# Patient Record
Sex: Female | Born: 1960 | Race: White | Hispanic: No | Marital: Married | Smoking: Never smoker
Health system: Southern US, Community
[De-identification: ages and names within clinical notes are randomized; demographics above are authoritative.]

## PROBLEM LIST (undated history)

## (undated) DIAGNOSIS — I10 Essential (primary) hypertension: Secondary | ICD-10-CM

## (undated) DIAGNOSIS — G5600 Carpal tunnel syndrome, unspecified upper limb: Secondary | ICD-10-CM

## (undated) DIAGNOSIS — K219 Gastro-esophageal reflux disease without esophagitis: Secondary | ICD-10-CM

## (undated) DIAGNOSIS — F32A Depression, unspecified: Secondary | ICD-10-CM

## (undated) DIAGNOSIS — J309 Allergic rhinitis, unspecified: Secondary | ICD-10-CM

## (undated) DIAGNOSIS — J189 Pneumonia, unspecified organism: Secondary | ICD-10-CM

## (undated) DIAGNOSIS — K589 Irritable bowel syndrome without diarrhea: Secondary | ICD-10-CM

## (undated) DIAGNOSIS — H269 Unspecified cataract: Secondary | ICD-10-CM

## (undated) DIAGNOSIS — F419 Anxiety disorder, unspecified: Secondary | ICD-10-CM

## (undated) DIAGNOSIS — F329 Major depressive disorder, single episode, unspecified: Secondary | ICD-10-CM

## (undated) DIAGNOSIS — Z9289 Personal history of other medical treatment: Secondary | ICD-10-CM

## (undated) HISTORY — DX: Anxiety disorder, unspecified: F41.9

## (undated) HISTORY — DX: Gastro-esophageal reflux disease without esophagitis: K21.9

## (undated) HISTORY — PX: BILATERAL CARPAL TUNNEL RELEASE: SHX6508

## (undated) HISTORY — DX: Irritable bowel syndrome, unspecified: K58.9

## (undated) HISTORY — DX: Depression, unspecified: F32.A

## (undated) HISTORY — DX: Unspecified cataract: H26.9

## (undated) HISTORY — DX: Personal history of other medical treatment: Z92.89

## (undated) HISTORY — DX: Allergic rhinitis, unspecified: J30.9

## (undated) HISTORY — DX: Major depressive disorder, single episode, unspecified: F32.9

## (undated) HISTORY — DX: Carpal tunnel syndrome, unspecified upper limb: G56.00

## (undated) HISTORY — DX: Essential (primary) hypertension: I10

## (undated) HISTORY — DX: Pneumonia, unspecified organism: J18.9

## (undated) HISTORY — PX: TUBAL LIGATION: SHX77

---

## 2005-04-11 ENCOUNTER — Ambulatory Visit: Payer: Self-pay | Admitting: Internal Medicine

## 2005-07-31 ENCOUNTER — Ambulatory Visit: Payer: Self-pay | Admitting: Unknown Physician Specialty

## 2006-10-10 ENCOUNTER — Ambulatory Visit: Payer: Self-pay | Admitting: Unknown Physician Specialty

## 2006-11-26 ENCOUNTER — Ambulatory Visit: Payer: Self-pay | Admitting: Unknown Physician Specialty

## 2007-03-25 IMAGING — US ABDOMEN ULTRASOUND
1 series · 17 of 25 positions shown · non-contrast
Comparison: none

REASON FOR EXAM: Abdominal Pain Epigastric
COMMENTS:

[Series 1: abdomen ultrasound · 17 of 49 slices shown]
[im 1/49]
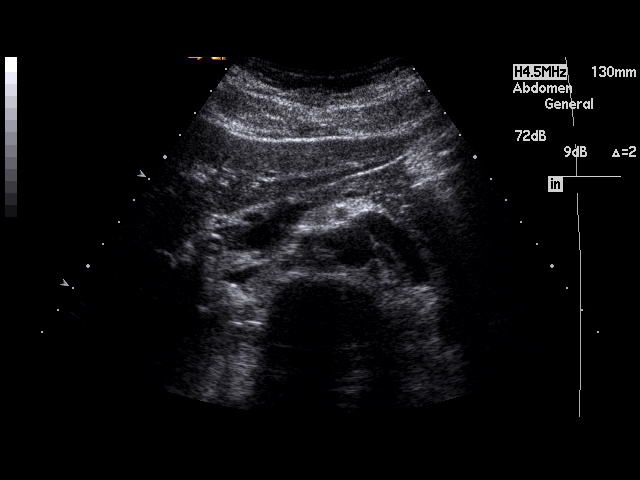
[im 5/49]
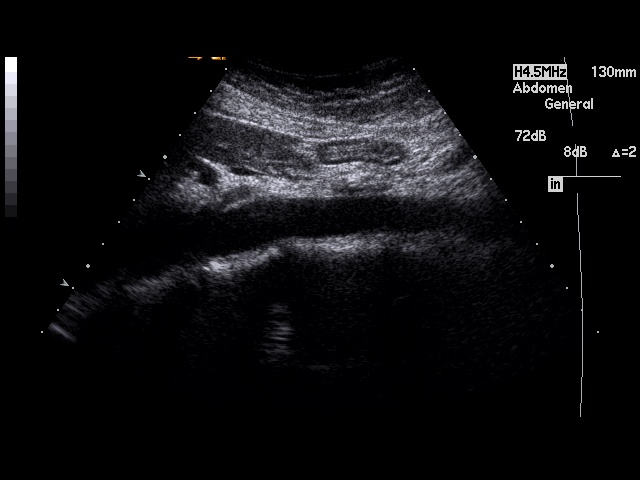
[im 7/49]
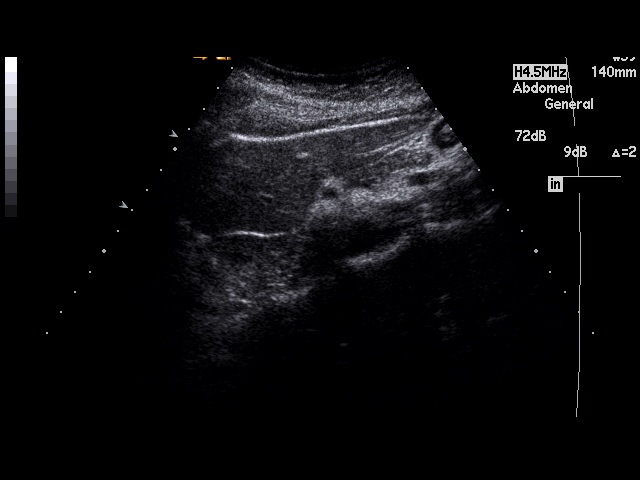
[im 11/49]
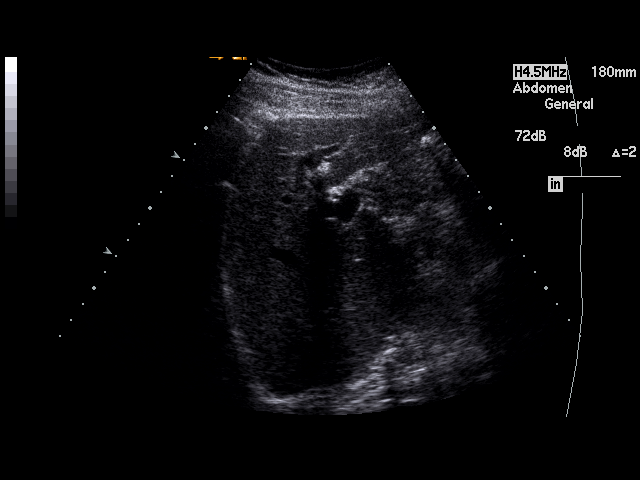
[im 13/49]
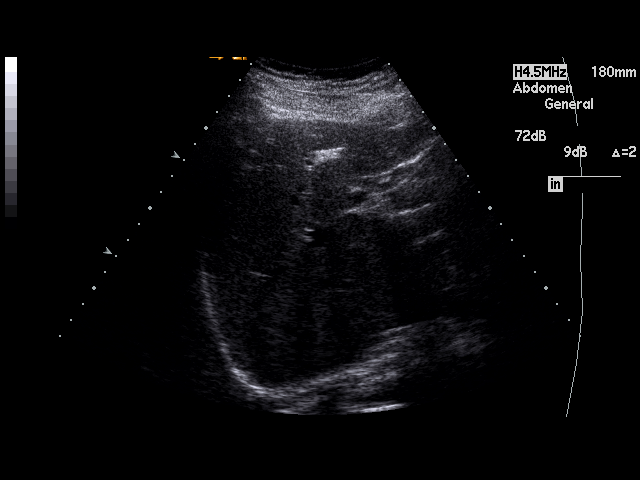
[im 17/49]
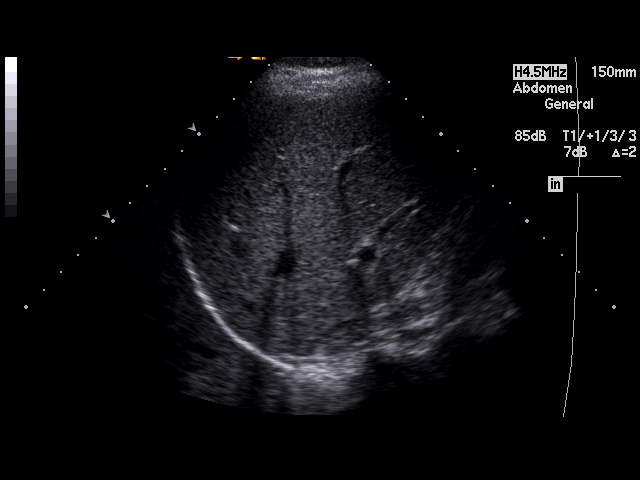
[im 19/49]
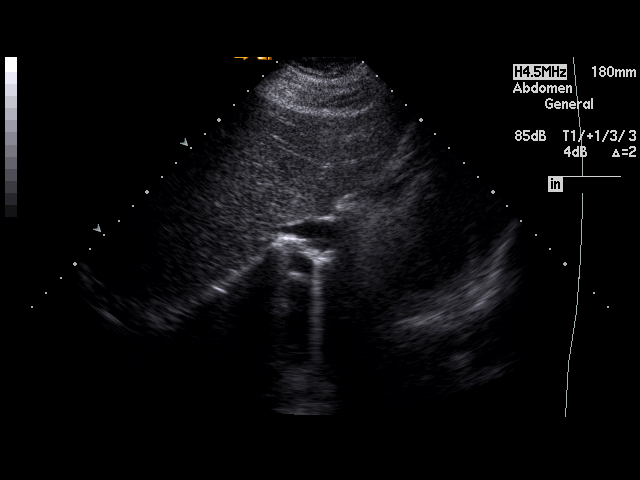
[im 23/49]
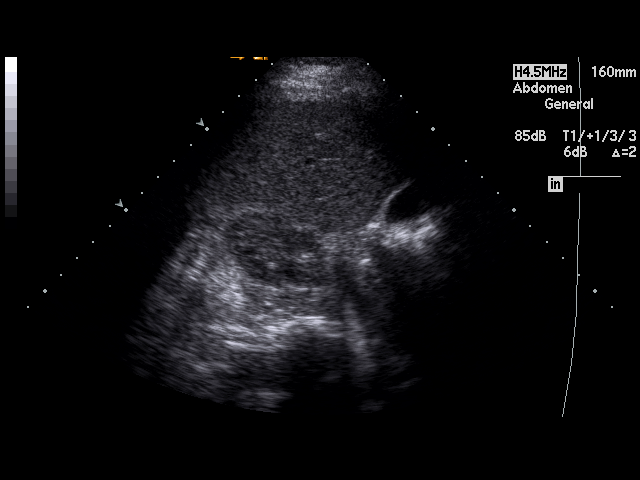
[im 25/49]
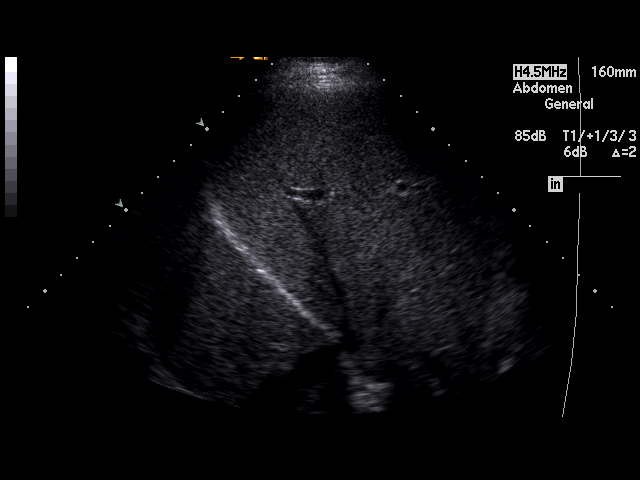
[im 27/49]
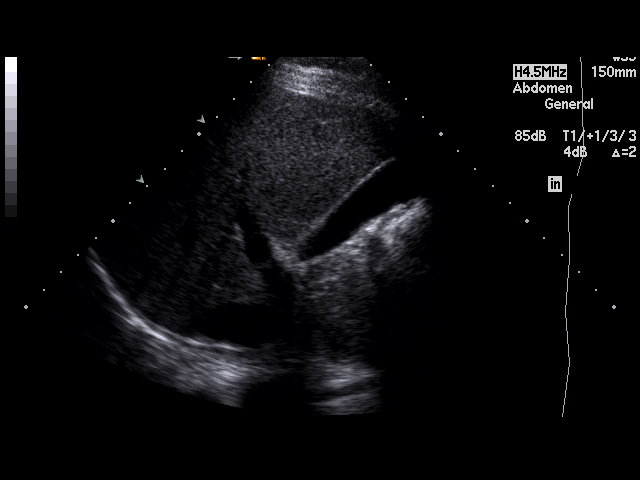
[im 31/49]
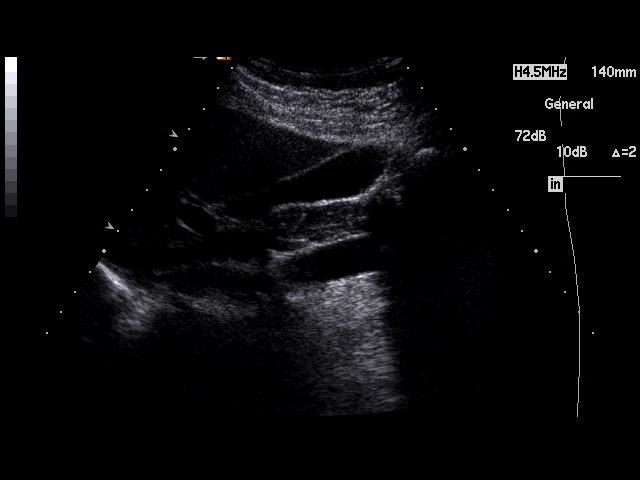
[im 33/49]
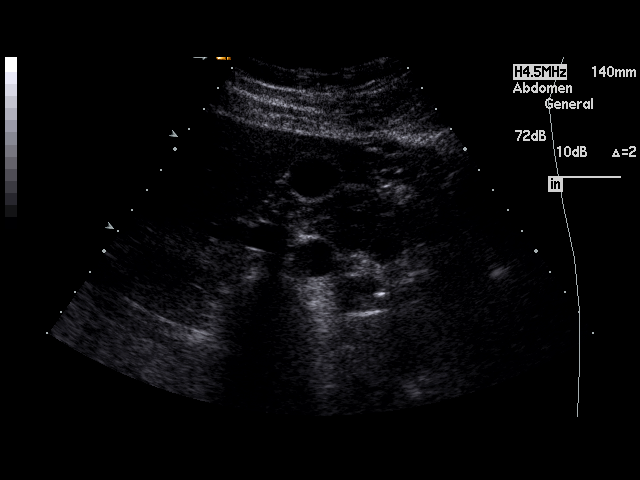
[im 37/49]
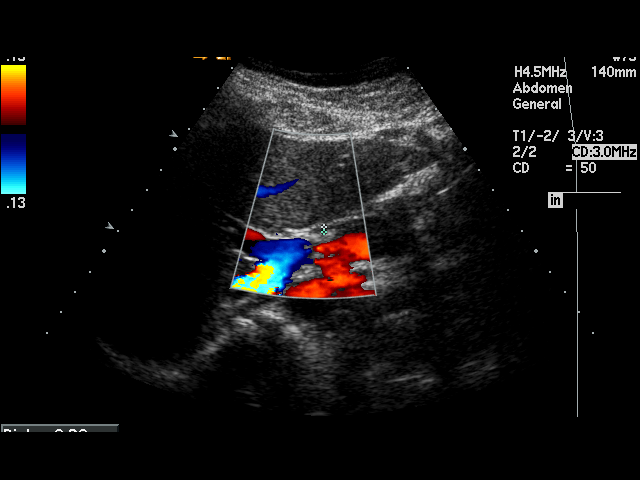
[im 39/49]
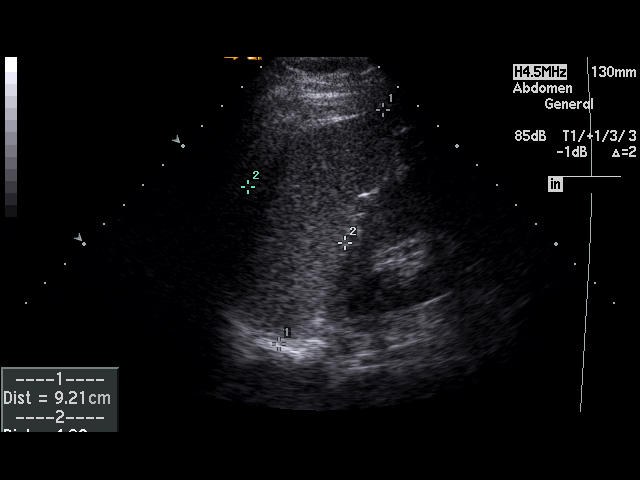
[im 43/49]
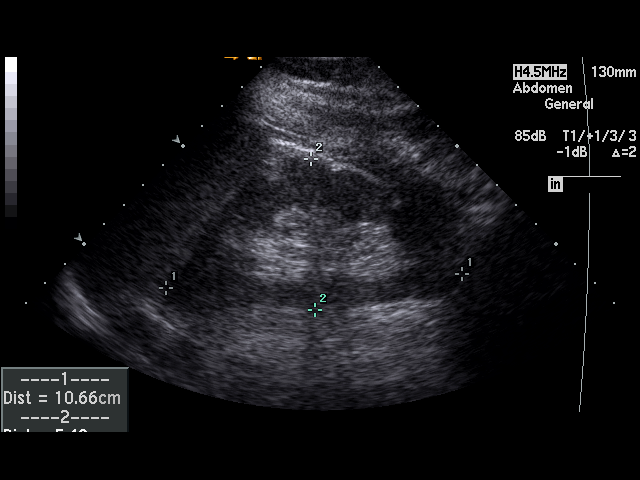
[im 45/49]
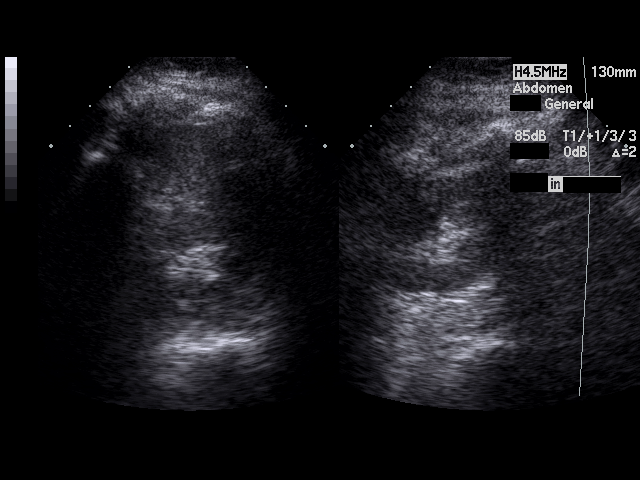
[im 49/49]
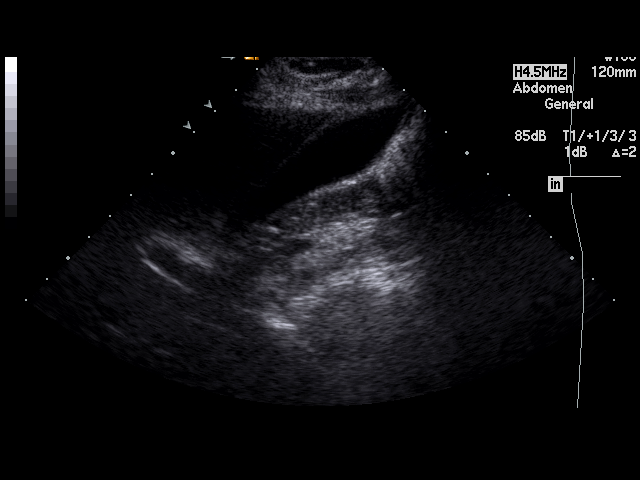

[17 of 25 positions shown; findings below may reference images not displayed]

PROCEDURE:     US  - US ABDOMEN GENERAL SURVEY  - November 26, 2006  [DATE]

RESULT:     The liver, spleen, pancreas and abdominal aorta are normal in
appearance. No gallstones are seen. There is no thickening of the
gallbladder wall. The common bile duct measures 2.4 mm in diameter, which is
within normal limits. The kidneys show no hydronephrosis. There is no
ascites.
IMPRESSION: 1.     No significant abnormalities are noted.

## 2007-07-24 ENCOUNTER — Ambulatory Visit: Payer: Self-pay | Admitting: Unknown Physician Specialty

## 2007-11-20 ENCOUNTER — Ambulatory Visit: Payer: Self-pay | Admitting: Unknown Physician Specialty

## 2008-12-07 ENCOUNTER — Ambulatory Visit: Payer: Self-pay | Admitting: Unknown Physician Specialty

## 2009-06-15 ENCOUNTER — Ambulatory Visit: Payer: Self-pay | Admitting: Urology

## 2009-08-08 ENCOUNTER — Emergency Department: Payer: Self-pay | Admitting: Emergency Medicine

## 2009-09-17 HISTORY — PX: COLONOSCOPY: SHX174

## 2009-12-20 ENCOUNTER — Ambulatory Visit: Payer: Self-pay | Admitting: Unknown Physician Specialty

## 2010-05-01 ENCOUNTER — Emergency Department: Payer: Self-pay | Admitting: Emergency Medicine

## 2011-01-01 ENCOUNTER — Ambulatory Visit: Payer: Self-pay | Admitting: Unknown Physician Specialty

## 2011-09-18 HISTORY — PX: BREAST BIOPSY: SHX20

## 2011-09-18 HISTORY — PX: BREAST SURGERY: SHX581

## 2012-01-23 ENCOUNTER — Ambulatory Visit: Payer: Self-pay | Admitting: Family Medicine

## 2012-11-25 ENCOUNTER — Ambulatory Visit: Payer: Self-pay | Admitting: Family Medicine

## 2014-02-10 ENCOUNTER — Ambulatory Visit: Payer: Self-pay | Admitting: Family Medicine

## 2014-09-17 HISTORY — PX: ESOPHAGOGASTRODUODENOSCOPY ENDOSCOPY: SHX5814

## 2016-01-31 ENCOUNTER — Other Ambulatory Visit: Payer: Self-pay | Admitting: Obstetrics and Gynecology

## 2016-01-31 DIAGNOSIS — Z1231 Encounter for screening mammogram for malignant neoplasm of breast: Secondary | ICD-10-CM

## 2016-03-13 ENCOUNTER — Ambulatory Visit
Admission: RE | Admit: 2016-03-13 | Discharge: 2016-03-13 | Disposition: A | Discharge status: Home / Self Care | Payer: Managed Care, Other (non HMO) | Source: Ambulatory Visit | Attending: Obstetrics and Gynecology | Admitting: Obstetrics and Gynecology

## 2016-03-13 DIAGNOSIS — Z1231 Encounter for screening mammogram for malignant neoplasm of breast: Secondary | ICD-10-CM | POA: Diagnosis not present

## 2016-03-13 DIAGNOSIS — Z9289 Personal history of other medical treatment: Secondary | ICD-10-CM

## 2016-03-13 HISTORY — DX: Personal history of other medical treatment: Z92.89

## 2016-08-17 DIAGNOSIS — J189 Pneumonia, unspecified organism: Secondary | ICD-10-CM

## 2016-08-17 HISTORY — DX: Pneumonia, unspecified organism: J18.9

## 2017-01-18 ENCOUNTER — Other Ambulatory Visit: Payer: Self-pay | Admitting: Certified Nurse Midwife

## 2017-01-18 NOTE — Telephone Encounter (Signed)
Please advise for refill. Thank you.  

## 2017-02-01 ENCOUNTER — Other Ambulatory Visit: Payer: Self-pay | Admitting: Obstetrics and Gynecology

## 2017-03-27 ENCOUNTER — Ambulatory Visit: Payer: Self-pay | Admitting: Certified Nurse Midwife

## 2017-04-11 ENCOUNTER — Other Ambulatory Visit: Payer: Self-pay | Admitting: Certified Nurse Midwife

## 2017-04-11 ENCOUNTER — Ambulatory Visit: Payer: Self-pay | Admitting: Certified Nurse Midwife

## 2017-04-11 NOTE — Telephone Encounter (Signed)
Please advise for refill. Thank you.  

## 2017-04-11 NOTE — Telephone Encounter (Signed)
I think this is usually prescribed by her PCP

## 2017-06-03 ENCOUNTER — Encounter: Payer: Self-pay | Admitting: Certified Nurse Midwife

## 2017-06-03 ENCOUNTER — Ambulatory Visit (INDEPENDENT_AMBULATORY_CARE_PROVIDER_SITE_OTHER): Payer: Commercial Managed Care - PPO | Admitting: Certified Nurse Midwife

## 2017-06-03 VITALS — BP 132/72 | HR 68 | Ht 63.0 in | Wt 119.0 lb

## 2017-06-03 DIAGNOSIS — F329 Major depressive disorder, single episode, unspecified: Secondary | ICD-10-CM | POA: Diagnosis not present

## 2017-06-03 DIAGNOSIS — Z1231 Encounter for screening mammogram for malignant neoplasm of breast: Secondary | ICD-10-CM | POA: Diagnosis not present

## 2017-06-03 DIAGNOSIS — F32A Depression, unspecified: Secondary | ICD-10-CM

## 2017-06-03 DIAGNOSIS — Z01419 Encounter for gynecological examination (general) (routine) without abnormal findings: Secondary | ICD-10-CM

## 2017-06-03 DIAGNOSIS — F419 Anxiety disorder, unspecified: Secondary | ICD-10-CM

## 2017-06-03 DIAGNOSIS — Z1239 Encounter for other screening for malignant neoplasm of breast: Secondary | ICD-10-CM

## 2017-06-03 DIAGNOSIS — Z124 Encounter for screening for malignant neoplasm of cervix: Secondary | ICD-10-CM

## 2017-06-03 MED ORDER — ALPRAZOLAM 0.5 MG PO TABS: ORAL_TABLET | ORAL | 0 refills | Status: DC

## 2017-06-03 MED ORDER — BUPROPION HCL ER (XL) 300 MG PO TB24: 300.0000 mg | ORAL_TABLET | Freq: Every day | ORAL | 0 refills | Status: DC

## 2017-06-03 NOTE — Progress Notes (Signed)
Gynecology Annual Exam  PCP: Patient, No Pcp Per  Chief Complaint:  Chief Complaint  Patient presents with  . Gynecologic Exam    History of Present Illness:Katie Gallegos presents today for her annual exam . She is a 56 year old Caucasian/White female , G 2 P 2 0 0 2 , whose LMP was 03/02/2016.  She is having more hot flashes and night sweats since her LMP and has started using an OTC product called Amberen about 2 months ago to help with the vasomotor symptoms, but hasn't seen any decrease in hot flashes or night sweats yet. Her anxiety has also increased with menopause.   The patient's past medical history is notable for a history of anxiety, depression, and hypertension. She is taking Wellbutrin 300 mgm XL daily and takes Xanax 0.5 mgm about 3x/week. Since her last annual GYN exam dated 03/22/2016, she is also under a little more stress at work. More recently she has been diagnosed with arthritis in her knees and she is currently using Lotrisone for a fungal rash under her right arm. She was hospitalized in Dec 2017 for pneumonia and pleurisy. She also reports that she has related the body rashes she started getting in 2016 to long exposure to the sun.  She had been referred for a screening colonoscopy in 2016 but it had not been 10 years since her last colonoscopy, so it was not needed. She is unsure when her next colonoscopy is due. She did have an EGD by Dr Markham Jordan.for GERD in 2016. Her PCP is at [facility] and they manage her HTN and labs.  She is sexually active. She does have vaginal dryness. She is using lubricants with symptomatic relief.  Her most recent pap smear was obtained 01/15/2014 and was with negative cells and negative HPV DNA. She has her Pap smears every 3 years.  Her most recent mammogram obtained on 03/13/2016 was normal. There is no family history of breast cancer. There is no family history of ovarian cancer. The patient does do monthly self breast exams.     A DEXA scan is not applicable for this patient.  The patient does not smoke.  The patient does drink 2 beers/week  The patient does not use illegal drugs.  The patient exercises regularly. Her BMI is 21.08 kg/m2 The patient does get adequate calcium in her diet.  She has her labs managed by her PCP. Her recent cholesterol screen in 2018 was normal.  Review of Systems: Review of Systems  Constitutional: Negative for chills, fever and weight loss.       6# weight gain  HENT: Negative for congestion, sinus pain and sore throat.   Eyes: Negative for blurred vision and pain.  Respiratory: Negative for hemoptysis, shortness of breath and wheezing.   Cardiovascular: Negative for chest pain, palpitations and leg swelling.  Gastrointestinal: Positive for constipation and heartburn. Negative for abdominal pain, blood in stool, diarrhea, nausea and vomiting.  Genitourinary: Negative for dysuria, frequency, hematuria and urgency.  Musculoskeletal: Positive for joint pain (bilateral knees). Negative for back pain and myalgias.  Skin: Positive for rash (under right arm). Negative for itching.  Neurological: Negative for dizziness, tingling and headaches.  Endo/Heme/Allergies: Negative for environmental allergies and polydipsia. Does not bruise/bleed easily.       Negative for hirsutism. Positive for hot flashes and night sweats   Psychiatric/Behavioral: Positive for depression. The patient is nervous/anxious. The patient does not have insomnia.  Past Medical History:  Past Medical History:  Diagnosis Date  . Allergic rhinitis   . Anxiety   . Carpal tunnel syndrome   . Depression   . GERD (gastroesophageal reflux disease)   . History of mammogram 03/13/2016   negative  . Hypertension   . IBS (irritable bowel syndrome)   . Pneumonia 08/2016    Past Surgical History:  Past Surgical History:  Procedure Laterality Date  . BILATERAL CARPAL TUNNEL RELEASE  March and April 2017  .  BREAST SURGERY Left 2013   breast biopsy  . COLONOSCOPY  2011  . ESOPHAGOGASTRODUODENOSCOPY ENDOSCOPY  2016   GERD  . TUBAL LIGATION      Family History:  Family History  Problem Relation Age of Onset  . Hypertension Mother   . Stroke Mother   . Hypertension Father   . Kidney Stones Father   . Uterine cancer Paternal Aunt 40  . Diabetes Maternal Grandmother   . Heart disease Maternal Grandmother   . Bladder Cancer Paternal Grandfather   . Colon cancer Paternal Grandfather 21    Social History:  Social History   Social History  . Marital status: Married    Spouse name: Sam  . Number of children: 2  . Years of education: N/A   Occupational History  . Not on file.   Social History Main Topics  . Smoking status: Never Smoker  . Smokeless tobacco: Never Used  . Alcohol use Yes     Comment: 2 beers per week  . Drug use: No  . Sexual activity: Yes    Birth control/ protection: Post-menopausal   Other Topics Concern  . Not on file   Social History Narrative  . No narrative on file    Allergies:  Allergies  Allergen Reactions  . Penicillin G Other (See Comments)    Taken as child ? reaction    Medications: Prior to Admission medications   Medication Sig Start Date End Date Taking? Authorizing Provider  ALPRAZolam (XANAX) 0.5 MG tablet alprazolam 0.5 mg tablet  TAKE 1 TABLET TWICE A DAY AS NEEDED   Yes [provider]  buPROPion (WELLBUTRIN XL) 300 MG 24 hr tablet  05/13/17  Yes [provider]  clotrimazole-betamethasone (LOTRISONE) cream  04/30/17  Yes [provider]  fluticasone (FLONASE) 50 MCG/ACT nasal spray  04/14/17  Yes [provider]  meloxicam (MOBIC) 15 MG tablet meloxicam 15 mg tablet   Yes [provider]  Multiple Vitamin (MULTIVITAMIN) capsule Take by mouth.   Yes [provider]  valsartan-hydrochlorothiazide (DIOVAN-HCT) 160-25 MG tablet valsartan 160 mg-hydrochlorothiazide 25 mg tablet   TAKE 1 TABLET EVERY DAY   Yes [provider]    Physical Exam Vitals: BP 132/72   Pulse 68   Ht  (1.6 m)   Wt 119 lb (54 kg)   LMP  (Exact Date)   BMI 21.08 kg/m   General: petite WF in NAD, appears anxious HEENT: normocephalic, anicteric Neck: no thyroid enlargement, no palpable nodules, no cervical lymphadenopathy  Pulmonary: No increased work of breathing, CTAB Cardiovascular: RRR, without murmur  Breast: Breast symmetrical, no tenderness, no palpable nodules or masses, no skin or nipple retraction present, no nipple discharge.  No axillary, infraclavicular or supraclavicular lymphadenopathy. Abdomen: Soft, non-tender, non-distended.  Umbilicus without lesions.  No hepatomegaly or masses palpable. No evidence of hernia. Genitourinary:  External: Normal external female genitalia.  Normal urethral meatus, normal  Bartholin's and Skene's glands.    Vagina:  Normal vaginal mucosa, no evidence of prolapse.    Cervix: Grossly normal in appearance, no bleeding, non-tender  Uterus: Anteverted, normal size, shape, and consistency, mobile, and non-tender  Adnexa: No adnexal masses, non-tender  Rectal: deferred  Lymphatic: no evidence of inguinal lymphadenopathy Extremities: no edema, erythema, or tenderness Neurologic: Grossly intact Psychiatric: mood appropriate, affect full     Assessment: 56 y.o. annual gynecological exam Vasomotor symptoms due to menopause Increased anxiety and depression  Plan:  Refilled Wellbutrin 300 mgm XL and Xanax, but recommend patient make appointment with PCP to discuss changing or adding to medication for depression and anxiety. Has been on current medication for >10 years. Was tried on other medications and did not do well per patient report.  1) Breast cancer screening - recommend monthly self breast exam. Mammogram was ordered today.  2) Colon cancer screen: review of [facility] records done and last colonoscopy was in November  2008  3) Cervical cancer screening - Pap was done. ASCCP guidelines and rational discussed.  Patient opts for every 3 years screening interval  4) Contraception - HAs a BTL, but contraception no longer needed now that she is postmenopausal.  5) Routine healthcare maintenance including cholesterol and diabetes screening managed by PCP   Farrel Conners, CNM

## 2017-06-04 ENCOUNTER — Encounter: Payer: Self-pay | Admitting: Certified Nurse Midwife

## 2017-06-04 DIAGNOSIS — K589 Irritable bowel syndrome without diarrhea: Secondary | ICD-10-CM | POA: Insufficient documentation

## 2017-06-04 DIAGNOSIS — J309 Allergic rhinitis, unspecified: Secondary | ICD-10-CM | POA: Insufficient documentation

## 2017-06-04 DIAGNOSIS — F32A Depression, unspecified: Secondary | ICD-10-CM | POA: Insufficient documentation

## 2017-06-04 DIAGNOSIS — I10 Essential (primary) hypertension: Secondary | ICD-10-CM | POA: Insufficient documentation

## 2017-06-04 DIAGNOSIS — F329 Major depressive disorder, single episode, unspecified: Secondary | ICD-10-CM | POA: Insufficient documentation

## 2017-06-04 DIAGNOSIS — K219 Gastro-esophageal reflux disease without esophagitis: Secondary | ICD-10-CM | POA: Insufficient documentation

## 2017-06-04 DIAGNOSIS — F419 Anxiety disorder, unspecified: Secondary | ICD-10-CM | POA: Insufficient documentation

## 2017-06-05 LAB — IGP, APTIMA HPV
HPV Aptima: POSITIVE — AB
PAP SMEAR COMMENT: 0

## 2017-06-12 ENCOUNTER — Encounter: Payer: Self-pay | Admitting: Certified Nurse Midwife

## 2017-06-12 DIAGNOSIS — R87618 Other abnormal cytological findings on specimens from cervix uteri: Secondary | ICD-10-CM | POA: Insufficient documentation

## 2017-06-12 DIAGNOSIS — R8789 Other abnormal findings in specimens from female genital organs: Secondary | ICD-10-CM | POA: Insufficient documentation

## 2017-09-27 ENCOUNTER — Ambulatory Visit
Admission: RE | Admit: 2017-09-27 | Discharge: 2017-09-27 | Disposition: A | Discharge status: Home / Self Care | Payer: Commercial Managed Care - PPO | Source: Ambulatory Visit | Attending: Certified Nurse Midwife | Admitting: Certified Nurse Midwife

## 2017-09-27 DIAGNOSIS — Z1231 Encounter for screening mammogram for malignant neoplasm of breast: Secondary | ICD-10-CM | POA: Insufficient documentation

## 2017-09-27 DIAGNOSIS — Z1239 Encounter for other screening for malignant neoplasm of breast: Secondary | ICD-10-CM

## 2017-10-02 ENCOUNTER — Encounter: Payer: Self-pay | Admitting: Certified Nurse Midwife

## 2018-10-30 NOTE — Progress Notes (Signed)
Gynecology Annual Exam  PCP: Patient, No Pcp Per  Chief Complaint:  Chief Complaint  Patient presents with  . Gynecologic Exam    pain and bleeding during intercourse x last 6-9 months    History of Present Illness:Katie Gallegos presents today for her annual exam . She is a 58 year old postmenopausal Caucasian/White female , G 2 P 2 0 0 2 , whose LMP was 03/02/2016.  She reports having postcoital bleeding and dyspareunia x 6 mos. Has had vaginal dryness and was using lubricants at one point, but has stopped using. She denies any vulvar itching or irritation. She notes bleeding when wiping after intercourse. She continues to have hot flashes. Is taking an OTC supplement to combat hot flashes.   The patient's past medical history is notable for a history of anxiety, depression, environmental allergies, and hypertension. She is taking Wellbutrin 300 mgm XL daily and takes Xanax 0.5 mgm about 2x/month. She requests a refill on her Xanax.   Since her last annual GYN exam dated 06/03/2017, she is has seen a dermatologist for rashes under her arms and inner thighs. She was also begun on Lipitor for hypercholesterolemia and her cholesterol level hs decreased on the medication. Her last colonoscopy was 2008 . Her next is due and she needs to return the [facility]. She did have an EGD by Dr Markham JordanElliot.for GERD in 2016. [facility] and they manage her HTN and labs.   Her most recent pap smear was obtained 06/03/2017 and was with negative cells and positive HPV DNA.  Her most recent mammogram obtained on 09/27/2017 was normal. There is no family history of breast cancer. There is no family history of ovarian cancer. The patient does do monthly self breast exams.   A DEXA scan is not applicable for this patient.  The patient does not smoke.  The patient does drink 1-2 beers or glasses of wine /week  The patient does not use illegal drugs.  The patient exercises regularly.  Her BMI is 20.2 kg/m2 The patient does get adequate calcium in her diet.  She has her labs managed by her PCP. Her recent cholesterol screen in 2020 was normal.  Review of Systems: Review of Systems  Constitutional: Positive for malaise/fatigue. Negative for chills, fever and weight loss.       6# weight gain  HENT: Positive for sore throat. Negative for congestion and sinus pain.   Eyes: Negative for blurred vision and pain.  Respiratory: Positive for cough (and sneezing) and shortness of breath. Negative for hemoptysis and wheezing.   Cardiovascular: Negative for chest pain, palpitations and leg swelling.  Gastrointestinal: Positive for constipation and heartburn. Negative for abdominal pain, blood in stool, diarrhea, nausea and vomiting.  Genitourinary: Negative for dysuria, frequency, hematuria and urgency.       Positive for nocturia, dyspareunia, and postcoital spotting.  Musculoskeletal: Positive for joint pain (bilateral knees). Negative for back pain and myalgias.  Skin: Positive for rash (under right arm). Negative for itching.  Neurological: Positive for dizziness. Negative for tingling and headaches.  Endo/Heme/Allergies: Positive for environmental allergies. Negative for polydipsia. Bruises/bleeds easily.       Negative for hirsutism. Positive for hot flashes   Psychiatric/Behavioral: Positive for depression. The patient is nervous/anxious. The patient does not have insomnia.     Past Medical History:  Past Medical History:  Diagnosis Date  . Allergic rhinitis   . Anxiety   .  Carpal tunnel syndrome   . Depression   . GERD (gastroesophageal reflux disease)   . History of mammogram 03/13/2016   negative  . Hypertension   . IBS (irritable bowel syndrome)   . Pneumonia 08/2016    Past Surgical History:  Past Surgical History:  Procedure Laterality Date  . BILATERAL CARPAL TUNNEL RELEASE  March and April 2017  . BREAST SURGERY Left 2013   breast biopsy  .  COLONOSCOPY  2011  . ESOPHAGOGASTRODUODENOSCOPY ENDOSCOPY  2016   GERD  . TUBAL LIGATION      Family History:  Family History  Problem Relation Age of Onset  . Hypertension Mother   . Stroke Mother   . Hypertension Father   . Kidney Stones Father   . Uterine cancer Paternal Aunt 40  . Diabetes Maternal Grandmother   . Heart disease Maternal Grandmother   . Bladder Cancer Paternal Grandfather   . Colon cancer Paternal Grandfather 50  . Breast cancer Neg Hx     Social History:  Social History   Socioeconomic History  . Marital status: Married    Spouse name: Sam  . Number of children: 2  . Years of education: Not on file  . Highest education level: Not on file  Occupational History  . Not on file  Social Needs  . Financial resource strain: Not on file  . Food insecurity:    Worry: Not on file    Inability: Not on file  . Transportation needs:    Medical: Not on file    Non-medical: Not on file  Tobacco Use  . Smoking status: Never Smoker  . Smokeless tobacco: Never Used  Substance and Sexual Activity  . Alcohol use: Yes    Comment: 2 beers per week  . Drug use: No  . Sexual activity: Yes    Partners: Male    Birth control/protection: Post-menopausal  Lifestyle  . Physical activity:    Days per week: Not on file    Minutes per session: Not on file  . Stress: Not on file  Relationships  . Social connections:    Talks on phone: Not on file    Gets together: Not on file    Attends religious service: Not on file    Active member of club or organization: Not on file    Attends meetings of clubs or organizations: Not on file    Relationship status: Not on file  . Intimate partner violence:    Fear of current or ex partner: Not on file    Emotionally abused: Not on file    Physically abused: Not on file    Forced sexual activity: Not on file  Other Topics Concern  . Not on file  Social History Narrative  . Not on file    Allergies:  Allergies    Allergen Reactions  . Penicillin G Other (See Comments)    Taken as child ? reaction    Medications:  Current Outpatient Medications on File Prior to Visit  Medication Sig Dispense Refill  . albuterol (PROAIR HFA) 108 (90 Base) MCG/ACT inhaler ProAir HFA 90 mcg/actuation aerosol inhaler  INHALE 2 PUFS TWICE A DAY    . ANTIFUNGAL 2 % cream APPLY THIN LAYER OF CREAM TO AFFECTED AREAS BILATERAL AXILLARY GROIN AND UPPER THIGHS TWICE A DAY    . atorvastatin (LIPITOR) 20 MG tablet     . buPROPion HCl ER, XL, 450 MG TB24     . clindamycin (CLEOCIN T)  1 % external solution     . fexofenadine (ALLEGRA) 180 MG tablet Take 180 mg by mouth daily.    . fluticasone (FLONASE) 50 MCG/ACT nasal spray     . meloxicam (MOBIC) 15 MG tablet meloxicam 15 mg tablet    . OXISTAT 1 % lotion APPLY TOPICALLY TO THE AFFECTED AREA EVERY DAY    . terbinafine (LAMISIL) 250 MG tablet     . valsartan-hydrochlorothiazide (DIOVAN-HCT) 160-25 MG tablet valsartan 160 mg-hydrochlorothiazide 25 mg tablet  TAKE 1 TABLET EVERY DAY     No current facility-administered medications on file prior to visit.    Physical Exam Vitals: BP 112/64   Pulse 75   Ht 5\' 3"  (1.6 m)   Wt 114 lb (51.7 kg)   BMI 20.19 kg/m   General: petite WF in NAD, appears anxious HEENT: normocephalic, anicteric Neck: no thyroid enlargement, no palpable nodules, no cervical lymphadenopathy  Pulmonary: No increased work of breathing, CTAB Cardiovascular: RRR, without murmur  Breast: Breast symmetrical, no tenderness, no palpable nodules or masses, no skin or nipple retraction present, no nipple discharge.  No axillary, infraclavicular or supraclavicular lymphadenopathy. Abdomen: Soft, non-tender, non-distended.  Umbilicus without lesions.  No hepatomegaly or masses palpable. No evidence of hernia. Genitourinary:  External: MIld atrophic changes. Normal urethral meatus, normal Bartholin's and Skene's glands.    Vagina: Flattened rugae, no evidence  of prolapse.    Cervix: Grossly normal in appearance, no bleeding, non-tender  Uterus: Anteverted, normal size, shape, and consistency, mobile, and non-tender  Adnexa: No adnexal masses, non-tender  Rectal: deferred  Lymphatic: no evidence of inguinal lymphadenopathy Extremities: no edema, erythema, or tenderness Neurologic: Grossly intact Psychiatric: mood appropriate, affect full     Assessment: 58 y.o. annual gynecological exam Dyspareunia and postcoital spotting  Long discussion reagrding options for treatment of dyspareunia and resulting postcoital spotting. Explained difference between lubricants and moisturizers as well as options for treatment with Intrarosa, various topical estrogens and Osphena. She would like to try Premarin vaginal cream. RX for 0.5 gm nightly x 2 weeks then 3x/week. Follow up in 6-8 weeks.  Anxiety and depression  Refilled Xanax 0.5 mgm BID prn anxiety #30/RF x 1 + HRHPV on last Pap smear.  Plan:   1) Breast cancer screening - recommend monthly self breast exam. Mammogram was ordered today. Patient to schedule mamm[facility] at [facility]  2) Colon cancer screen: [facility].  3) Cervical cancer screening - Pap was done. ASCCP guidelines and rational discussed.    4) Contraception - HAs a BTL, but contraception no longer needed now that she is postmenopausal.  5) Routine healthcare maintenance including cholesterol and diabetes screening managed by PCP   6) Osteoporosis prevention: Discussed requirements for calcium and vitamin D3   Farrel Connersolleen Noel Rodier, CNM

## 2018-10-31 ENCOUNTER — Encounter: Payer: Self-pay | Admitting: Certified Nurse Midwife

## 2018-10-31 ENCOUNTER — Other Ambulatory Visit (HOSPITAL_COMMUNITY)
Admission: RE | Admit: 2018-10-31 | Discharge: 2018-10-31 | Disposition: A | Discharge status: Home / Self Care | Payer: Commercial Managed Care - PPO | Source: Ambulatory Visit | Attending: Certified Nurse Midwife | Admitting: Certified Nurse Midwife

## 2018-10-31 ENCOUNTER — Ambulatory Visit (INDEPENDENT_AMBULATORY_CARE_PROVIDER_SITE_OTHER): Payer: Commercial Managed Care - PPO | Admitting: Certified Nurse Midwife

## 2018-10-31 VITALS — BP 112/64 | HR 75 | Ht 63.0 in | Wt 114.0 lb

## 2018-10-31 DIAGNOSIS — Z01419 Encounter for gynecological examination (general) (routine) without abnormal findings: Secondary | ICD-10-CM

## 2018-10-31 DIAGNOSIS — R87618 Other abnormal cytological findings on specimens from cervix uteri: Secondary | ICD-10-CM

## 2018-10-31 DIAGNOSIS — N941 Unspecified dyspareunia: Secondary | ICD-10-CM

## 2018-10-31 DIAGNOSIS — Z1239 Encounter for other screening for malignant neoplasm of breast: Secondary | ICD-10-CM

## 2018-10-31 DIAGNOSIS — F419 Anxiety disorder, unspecified: Secondary | ICD-10-CM

## 2018-10-31 DIAGNOSIS — Z124 Encounter for screening for malignant neoplasm of cervix: Secondary | ICD-10-CM

## 2018-10-31 DIAGNOSIS — R8789 Other abnormal findings in specimens from female genital organs: Secondary | ICD-10-CM

## 2018-10-31 DIAGNOSIS — N930 Postcoital and contact bleeding: Secondary | ICD-10-CM

## 2018-10-31 DIAGNOSIS — N950 Postmenopausal bleeding: Secondary | ICD-10-CM

## 2018-10-31 MED ORDER — ALPRAZOLAM 0.5 MG PO TABS: 0.5000 mg | ORAL_TABLET | Freq: Two times a day (BID) | ORAL | 1 refills | Status: DC | PRN

## 2018-10-31 MED ORDER — ESTROGENS, CONJUGATED 0.625 MG/GM VA CREA: TOPICAL_CREAM | VAGINAL | 1 refills | Status: DC

## 2018-10-31 MED ORDER — ALPRAZOLAM 0.5 MG PO TABS: ORAL_TABLET | ORAL | 1 refills | Status: DC

## 2018-11-01 ENCOUNTER — Encounter: Payer: Self-pay | Admitting: Certified Nurse Midwife

## 2018-11-01 DIAGNOSIS — E785 Hyperlipidemia, unspecified: Secondary | ICD-10-CM | POA: Insufficient documentation

## 2018-11-05 LAB — CYTOLOGY - PAP: HPV: DETECTED — AB

## 2018-11-10 ENCOUNTER — Telehealth: Payer: Self-pay | Admitting: Certified Nurse Midwife

## 2018-11-10 NOTE — Telephone Encounter (Signed)
Lorrene was called with results of Pap smear (LGSIL Pap with positive HRHPV) and need for colposcopy. DIscussed colposcopy procedure. Scheduled for colposcopy with Dr Jerene Pitch.   Farrel Conners, CNM

## 2018-11-20 ENCOUNTER — Ambulatory Visit
Admission: RE | Admit: 2018-11-20 | Discharge: 2018-11-20 | Disposition: A | Discharge status: Home / Self Care | Payer: Commercial Managed Care - PPO | Source: Ambulatory Visit | Attending: Certified Nurse Midwife | Admitting: Certified Nurse Midwife

## 2018-11-20 DIAGNOSIS — Z1231 Encounter for screening mammogram for malignant neoplasm of breast: Secondary | ICD-10-CM | POA: Insufficient documentation

## 2018-11-20 DIAGNOSIS — Z1239 Encounter for other screening for malignant neoplasm of breast: Secondary | ICD-10-CM | POA: Diagnosis present

## 2018-11-21 ENCOUNTER — Ambulatory Visit (INDEPENDENT_AMBULATORY_CARE_PROVIDER_SITE_OTHER): Payer: Commercial Managed Care - PPO | Admitting: Obstetrics and Gynecology

## 2018-11-21 ENCOUNTER — Encounter: Payer: Self-pay | Admitting: Obstetrics and Gynecology

## 2018-11-21 ENCOUNTER — Other Ambulatory Visit (HOSPITAL_COMMUNITY)
Admission: RE | Admit: 2018-11-21 | Discharge: 2018-11-21 | Disposition: A | Discharge status: Home / Self Care | Payer: Commercial Managed Care - PPO | Source: Ambulatory Visit | Attending: Obstetrics and Gynecology | Admitting: Obstetrics and Gynecology

## 2018-11-21 VITALS — BP 140/80 | HR 74 | Ht 63.0 in | Wt 114.0 lb

## 2018-11-21 DIAGNOSIS — R87612 Low grade squamous intraepithelial lesion on cytologic smear of cervix (LGSIL): Secondary | ICD-10-CM | POA: Insufficient documentation

## 2018-11-21 DIAGNOSIS — N870 Mild cervical dysplasia: Secondary | ICD-10-CM | POA: Diagnosis not present

## 2018-11-21 NOTE — Progress Notes (Signed)
   GYNECOLOGY CLINIC COLPOSCOPY PROCEDURE NOTE  58 y.o. [id number] here for colposcopy for low-grade squamous intraepithelial neoplasia (LGSIL - encompassing HPV,mild dysplasia,CIN I)  pap smear on 10/31/2018. On 06/03/2017 she had a NIL HPV + pap smear. Discussed underlying role for HPV infection in the development of cervical dysplasia, its natural history and progression/regression, need for surveillance.  Is the patient  pregnant: No LMP: No LMP recorded. Patient is postmenopausal. Smoking status:  reports that she has never smoked. She has never used smokeless tobacco. Contraception: none  Patient given informed consent, signed copy in the chart, time out was performed.  The patient was position in dorsal lithotomy position. Speculum was placed the cervix was visualized.   After application of acetic acid colposcopic inspection of the cervix was undertaken.   Colposcopy adequate, full visualization of transformation zone: Yes acetowhite lesion(s) noted at 12 and 5 o'clock; corresponding biopsies obtained.   ECC specimen obtained:  Yes  All specimens were labeled and sent to pathology.   Patient was given post procedure instructions.  Will follow up pathology and manage accordingly.  Routine preventative health maintenance measures emphasized.  Physical Exam Genitourinary:        Genitourinary Comments: Areas highlighted by lugols solution     Adelene Idler MD [facility], [facility] 11/21/2018 2:04 PM

## 2018-12-05 ENCOUNTER — Telehealth: Payer: Self-pay | Admitting: Obstetrics and Gynecology

## 2018-12-05 NOTE — Telephone Encounter (Signed)
Please advise. Thank you

## 2018-12-05 NOTE — Telephone Encounter (Signed)
Patient is calling for labs results. Please advise. 

## 2018-12-05 NOTE — Progress Notes (Signed)
Called and discussed with patient. 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

## 2018-12-25 ENCOUNTER — Ambulatory Visit: Payer: Commercial Managed Care - PPO | Admitting: Certified Nurse Midwife

## 2019-04-14 ENCOUNTER — Other Ambulatory Visit: Payer: Self-pay | Admitting: Certified Nurse Midwife

## 2019-11-23 ENCOUNTER — Ambulatory Visit: Payer: Commercial Managed Care - PPO | Admitting: Certified Nurse Midwife

## 2019-12-03 NOTE — Progress Notes (Signed)
Gynecology Annual Exam  PCP: Farrel Conners, CNM  Chief Complaint:  Chief Complaint  Patient presents with  . Gynecologic Exam    still a little bit of anxiety; had a couple panic attacks a few months ago    History of Present Illness:Katie Gallegos presents today for her annual exam . She is a 59 year old postmenopausal Caucasian/White female , G 2 P 2 0 0 2 , whose LMP was 03/02/2016.  She reported having postcoital bleeding and dyspareunia at her last annual and was prescribed vaginal estrogen cream which did help. She has stopped using the vaginal cream (just forgot to use at hs) She continues to have some hot flashes.   The patient's past medical history is notable for a history of anxiety, depression, environmental allergies, hypercholesterolemia, and hypertension. She is taking Wellbutrin 150 mgm XL daily and takes Xanax 0.5 mgm occasionally.She had a panic attack this past year and her PCP took her off the Wellbutrin for a while, then restarted her at a lower dose.  She requests a refill on her Xanax.   Since her last annual GYN exam dated 10/31/2018, she has had no other significant changes in her health. Her last colonoscopy was 2011? Marland Kitchen Her next is due. Tried scheduling it last year, but then the Covid pandemic hit and all elective surgeries were cancelled. She did have an EGD by Dr Markham Jordan.for GERD in 2016. Her PCP is at [facility] and they manage her HTN and labs.   Her most recent pap smear was obtained 10/31/2018 and was LGSIL and positive HPV DNA. 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 She was advised to repeat her Pap in 1 year. Her most recent mammogram obtained on 11/20/2018 was normal. There is no family history of breast cancer. There is no family history of ovarian cancer. The patient does do monthly self breast exams.  She has not had a DEXA scan The patient does not smoke.  The patient does drink 1-2 beers or glasses of wine  /week  The patient does not use illegal drugs.  The patient exercises regularly. Her BMI is 20.73 kg/m2 The patient does get adequate calcium in her diet.  She has her labs managed by her PCP. Her recent cholesterol screen in 2021 was abnormal as she was off her statin at the time  Review of Systems: Review of Systems  Constitutional: Negative for chills, fever, malaise/fatigue and weight loss.       6# weight gain  HENT: Negative for congestion, sinus pain and sore throat.   Eyes: Negative for blurred vision and pain.  Respiratory: Negative for cough, hemoptysis, shortness of breath and wheezing.   Cardiovascular: Negative for chest pain, palpitations and leg swelling.  Gastrointestinal: Negative for abdominal pain, blood in stool, constipation, diarrhea, heartburn, nausea and vomiting.  Genitourinary: Negative for dysuria, frequency, hematuria and urgency.       Positive for dyspareunia  Musculoskeletal: Positive for joint pain (bilateral knees). Negative for back pain and myalgias.  Skin: Positive for itching and rash.  Neurological: Negative for dizziness, tingling and headaches.  Endo/Heme/Allergies: Positive for environmental allergies. Negative for polydipsia. Does not bruise/bleed easily.        Positive for hot flashes   Psychiatric/Behavioral: Negative for depression. The patient is nervous/anxious. The patient does not have insomnia.     Past Medical History:  Past Medical History:  Diagnosis Date  . Allergic rhinitis   .  Anxiety   . Carpal tunnel syndrome   . Depression   . GERD (gastroesophageal reflux disease)   . History of mammogram 03/13/2016   negative  . Hypertension   . IBS (irritable bowel syndrome)   . Pneumonia 08/2016    Past Surgical History:  Past Surgical History:  Procedure Laterality Date  . BILATERAL CARPAL TUNNEL RELEASE  March and April 2017  . BREAST SURGERY Left 2013   breast biopsy  . COLONOSCOPY  2011  . ESOPHAGOGASTRODUODENOSCOPY  ENDOSCOPY  2016   GERD  . TUBAL LIGATION      Family History:  Family History  Problem Relation Age of Onset  . Hypertension Mother   . Stroke Mother   . Hypertension Father   . Kidney Stones Father   . Uterine cancer Paternal Aunt 12  . Diabetes Maternal Grandmother   . Heart disease Maternal Grandmother   . Bladder Cancer Paternal Grandfather   . Colon cancer Paternal Grandfather 43  . Breast cancer Neg Hx     Social History:  Social History   Socioeconomic History  . Marital status: Married    Spouse name: Sam  . Number of children: 2  . Years of education: Not on file  . Highest education level: Not on file  Occupational History  . Not on file  Tobacco Use  . Smoking status: Never Smoker  . Smokeless tobacco: Never Used  Substance and Sexual Activity  . Alcohol use: Yes    Comment: 2 beers per week  . Drug use: No  . Sexual activity: Yes    Partners: Male    Birth control/protection: Post-menopausal  Other Topics Concern  . Not on file  Social History Narrative  . Not on file   Social Determinants of Health   Financial Resource Strain:   . Difficulty of Paying Living Expenses:   Food Insecurity:   . Worried About Charity fundraiser in the Last Year:   . Arboriculturist in the Last Year:   Transportation Needs:   . Film/video editor (Medical):   Marland Kitchen Lack of Transportation (Non-Medical):   Physical Activity:   . Days of Exercise per Week:   . Minutes of Exercise per Session:   Stress:   . Feeling of Stress :   Social Connections:   . Frequency of Communication with Friends and Family:   . Frequency of Social Gatherings with Friends and Family:   . Attends Religious Services:   . Active Member of Clubs or Organizations:   . Attends Archivist Meetings:   Marland Kitchen Marital Status:   Intimate Partner Violence:   . Fear of Current or Ex-Partner:   . Emotionally Abused:   Marland Kitchen Physically Abused:   . Sexually Abused:     Allergies:  Allergies   Allergen Reactions  . Penicillin G Other (See Comments)    Taken as child ? reaction    Medications:  Current Outpatient Medications on File Prior to Visit  Medication Sig Dispense Refill  . albuterol (PROAIR HFA) 108 (90 Base) MCG/ACT inhaler ProAir HFA 90 mcg/actuation aerosol inhaler  INHALE 2 PUFS TWICE A DAY    . ANTIFUNGAL 2 % cream APPLY THIN LAYER OF CREAM TO AFFECTED AREAS BILATERAL AXILLARY GROIN AND UPPER THIGHS TWICE A DAY    . atorvastatin (LIPITOR) 20 MG tablet     . buPROPion (ZYBAN) 150 MG 12 hr tablet Take 150 mg by mouth 2 (two) times daily.    Marland Kitchen  clindamycin (CLEOCIN T) 1 % external solution     . fexofenadine (ALLEGRA) 180 MG tablet Take 180 mg by mouth daily.    . fluticasone (FLONASE) 50 MCG/ACT nasal spray     . hydrocortisone 2.5 % cream TAKE 1(ONE) APPLICATION(S) TOPICAL 2(TWO) TIMES A DAY AS NEEDED FOR FLARES    . meloxicam (MOBIC) 15 MG tablet meloxicam 15 mg tablet    . OXISTAT 1 % lotion APPLY TOPICALLY TO THE AFFECTED AREA EVERY DAY    . terbinafine (LAMISIL) solution     . valsartan-hydrochlorothiazide (DIOVAN-HCT) 160-25 MG tablet valsartan 160 mg-hydrochlorothiazide 25 mg tablet  TAKE 1 TABLET EVERY DAY    . conjugated estrogens (PREMARIN) vaginal cream 0.5 gram vaginally nightly at bedtime for 2 weeks, then 0.5 gram vaginally three times weekly (Patient not taking: Reported on 11/21/2018) 30 g 1   No current facility-administered medications on file prior to visit.   Physical Exam Vitals: BP 130/80   Pulse 72   Ht 5\' 3"  (1.6 m)   Wt 117 lb (53.1 kg)   BMI 20.73 kg/m   General: petite WF in NAD HEENT: normocephalic, anicteric Neck: no thyroid enlargement, no palpable nodules, no cervical lymphadenopathy  Pulmonary: No increased work of breathing, CTAB Cardiovascular: RRR, without murmur  Breast: Breast symmetrical, no tenderness, no palpable nodules or masses, no skin or nipple retraction present, no nipple discharge.  No axillary,  infraclavicular or supraclavicular lymphadenopathy. Abdomen: Soft, non-tender, non-distended.  Umbilicus without lesions.  No hepatomegaly or masses palpable. No evidence of hernia. Genitourinary:  External: MIld atrophic changes. Normal urethral meatus, normal Bartholin's and Skene's glands.    Vagina: Flattened rugae, no evidence of prolapse.    Cervix: Grossly normal in appearance, no bleeding, non-tender  Uterus: Anteverted, normal size, shape, and consistency, mobile, and non-tender  Adnexa: No adnexal masses, non-tender  Rectal: deferred  Lymphatic: no evidence of inguinal lymphadenopathy Extremities: no edema, erythema, or tenderness Neurologic: Grossly intact Psychiatric: mood appropriate, affect full     Assessment: 59 y.o. annual gynecological exam Dyspareunia-would like to resume estrogen vaginal cream-can just use 2x/week if desires    Anxiety and depression  Refilled Xanax 0.5 mgm BID prn anxiety #30/RF x 1  LGSIL /+ HRHPV on last Pap smear. 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 Mammogram was ordered today. Patient to schedule mammogram at [facility]  2) Colon cancer screen: consult to [facility] to schedule colonoscopy.  3) Cervical cancer screening - Pap was done.  4) Routine healthcare maintenance including cholesterol and diabetes screening managed by PCP   5) Osteoporosis prevention: Discussed requirements for calcium and vitamin D3   6) RTO in 1 year and prn  [facility], CNM

## 2019-12-04 ENCOUNTER — Ambulatory Visit (INDEPENDENT_AMBULATORY_CARE_PROVIDER_SITE_OTHER): Payer: Commercial Managed Care - PPO | Admitting: Certified Nurse Midwife

## 2019-12-04 ENCOUNTER — Encounter: Payer: Self-pay | Admitting: Certified Nurse Midwife

## 2019-12-04 ENCOUNTER — Other Ambulatory Visit: Payer: Self-pay

## 2019-12-04 ENCOUNTER — Other Ambulatory Visit (HOSPITAL_COMMUNITY)
Admission: RE | Admit: 2019-12-04 | Discharge: 2019-12-04 | Disposition: A | Discharge status: Home / Self Care | Payer: Commercial Managed Care - PPO | Source: Ambulatory Visit | Attending: Certified Nurse Midwife | Admitting: Certified Nurse Midwife

## 2019-12-04 VITALS — BP 130/80 | HR 72 | Ht 63.0 in | Wt 117.0 lb

## 2019-12-04 DIAGNOSIS — N870 Mild cervical dysplasia: Secondary | ICD-10-CM | POA: Insufficient documentation

## 2019-12-04 DIAGNOSIS — Z01419 Encounter for gynecological examination (general) (routine) without abnormal findings: Secondary | ICD-10-CM | POA: Diagnosis not present

## 2019-12-04 DIAGNOSIS — R87612 Low grade squamous intraepithelial lesion on cytologic smear of cervix (LGSIL): Secondary | ICD-10-CM

## 2019-12-04 DIAGNOSIS — Z1211 Encounter for screening for malignant neoplasm of colon: Secondary | ICD-10-CM

## 2019-12-04 DIAGNOSIS — N941 Unspecified dyspareunia: Secondary | ICD-10-CM

## 2019-12-04 DIAGNOSIS — F413 Other mixed anxiety disorders: Secondary | ICD-10-CM | POA: Diagnosis not present

## 2019-12-04 DIAGNOSIS — Z1239 Encounter for other screening for malignant neoplasm of breast: Secondary | ICD-10-CM

## 2019-12-04 MED ORDER — ALPRAZOLAM 0.5 MG PO TABS: 0.5000 mg | ORAL_TABLET | Freq: Two times a day (BID) | ORAL | 1 refills | Status: DC | PRN

## 2019-12-09 LAB — CYTOLOGY - PAP
Comment: NEGATIVE
Diagnosis: NEGATIVE
High risk HPV: NEGATIVE

## 2019-12-30 ENCOUNTER — Ambulatory Visit
Admission: RE | Admit: 2019-12-30 | Discharge: 2019-12-30 | Disposition: A | Discharge status: Home / Self Care | Payer: Commercial Managed Care - PPO | Source: Ambulatory Visit | Attending: Certified Nurse Midwife | Admitting: Certified Nurse Midwife

## 2019-12-30 DIAGNOSIS — Z1231 Encounter for screening mammogram for malignant neoplasm of breast: Secondary | ICD-10-CM | POA: Insufficient documentation

## 2019-12-30 DIAGNOSIS — Z1239 Encounter for other screening for malignant neoplasm of breast: Secondary | ICD-10-CM

## 2020-12-05 ENCOUNTER — Ambulatory Visit (INDEPENDENT_AMBULATORY_CARE_PROVIDER_SITE_OTHER): Payer: Commercial Managed Care - PPO | Admitting: Advanced Practice Midwife

## 2020-12-05 ENCOUNTER — Other Ambulatory Visit: Payer: Self-pay

## 2020-12-05 ENCOUNTER — Encounter: Payer: Self-pay | Admitting: Advanced Practice Midwife

## 2020-12-05 VITALS — BP 122/74 | Ht 62.0 in | Wt 117.0 lb

## 2020-12-05 DIAGNOSIS — Z1239 Encounter for other screening for malignant neoplasm of breast: Secondary | ICD-10-CM

## 2020-12-05 DIAGNOSIS — F419 Anxiety disorder, unspecified: Secondary | ICD-10-CM | POA: Diagnosis not present

## 2020-12-05 DIAGNOSIS — Z0000 Encounter for general adult medical examination without abnormal findings: Secondary | ICD-10-CM | POA: Diagnosis not present

## 2020-12-05 DIAGNOSIS — Z1211 Encounter for screening for malignant neoplasm of colon: Secondary | ICD-10-CM | POA: Diagnosis not present

## 2020-12-05 MED ORDER — ALPRAZOLAM 0.5 MG PO TABS: 0.5000 mg | ORAL_TABLET | Freq: Two times a day (BID) | ORAL | 1 refills | Status: DC | PRN

## 2020-12-05 NOTE — Patient Instructions (Signed)
Health Maintenance, Female Adopting a healthy lifestyle and getting preventive care are important in promoting health and wellness. Ask your health care provider about:  The right schedule for you to have regular tests and exams.  Things you can do on your own to prevent diseases and keep yourself healthy. What should I know about diet, weight, and exercise? Eat a healthy diet  Eat a diet that includes plenty of vegetables, fruits, low-fat dairy products, and lean protein.  Do not eat a lot of foods that are high in solid fats, added sugars, or sodium.   Maintain a healthy weight Body mass index (BMI) is used to identify weight problems. It estimates body fat based on height and weight. Your health care provider can help determine your BMI and help you achieve or maintain a healthy weight. Get regular exercise Get regular exercise. This is one of the most important things you can do for your health. Most adults should:  Exercise for at least 150 minutes each week. The exercise should increase your heart rate and make you sweat (moderate-intensity exercise).  Do strengthening exercises at least twice a week. This is in addition to the moderate-intensity exercise.  Spend less time sitting. Even light physical activity can be beneficial. Watch cholesterol and blood lipids Have your blood tested for lipids and cholesterol at 58 years of age, then have this test every 5 years. Have your cholesterol levels checked more often if:  Your lipid or cholesterol levels are high.  You are older than 58 years of age.  You are at high risk for heart disease. What should I know about cancer screening? Depending on your health history and family history, you may need to have cancer screening at various ages. This may include screening for:  Breast cancer.  Cervical cancer.  Colorectal cancer.  Skin cancer.  Lung cancer. What should I know about heart disease, diabetes, and high blood  pressure? Blood pressure and heart disease  High blood pressure causes heart disease and increases the risk of stroke. This is more likely to develop in people who have high blood pressure readings, are of African descent, or are overweight.  Have your blood pressure checked: ? Every 3-5 years if you are 18-39 years of age. ? Every year if you are 40 years old or older. Diabetes Have regular diabetes screenings. This checks your fasting blood sugar level. Have the screening done:  Once every three years after age 40 if you are at a normal weight and have a low risk for diabetes.  More often and at a younger age if you are overweight or have a high risk for diabetes. What should I know about preventing infection? Hepatitis B If you have a higher risk for hepatitis B, you should be screened for this virus. Talk with your health care provider to find out if you are at risk for hepatitis B infection. Hepatitis C Testing is recommended for:  Everyone born from 1945 through 1965.  Anyone with known risk factors for hepatitis C. Sexually transmitted infections (STIs)  Get screened for STIs, including gonorrhea and chlamydia, if: ? You are sexually active and are younger than 58 years of age. ? You are older than 58 years of age and your health care provider tells you that you are at risk for this type of infection. ? Your sexual activity has changed since you were last screened, and you are at increased risk for chlamydia or gonorrhea. Ask your health care provider   if you are at risk.  Ask your health care provider about whether you are at high risk for HIV. Your health care provider may recommend a prescription medicine to help prevent HIV infection. If you choose to take medicine to prevent HIV, you should first get tested for HIV. You should then be tested every 3 months for as long as you are taking the medicine. Pregnancy  If you are about to stop having your period (premenopausal) and  you may become pregnant, seek counseling before you get pregnant.  Take 400 to 800 micrograms (mcg) of folic acid every day if you become pregnant.  Ask for birth control (contraception) if you want to prevent pregnancy. Osteoporosis and menopause Osteoporosis is a disease in which the bones lose minerals and strength with aging. This can result in bone fractures. If you are 65 years old or older, or if you are at risk for osteoporosis and fractures, ask your health care provider if you should:  Be screened for bone loss.  Take a calcium or vitamin D supplement to lower your risk of fractures.  Be given hormone replacement therapy (HRT) to treat symptoms of menopause. Follow these instructions at home: Lifestyle  Do not use any products that contain nicotine or tobacco, such as cigarettes, e-cigarettes, and chewing tobacco. If you need help quitting, ask your health care provider.  Do not use street drugs.  Do not share needles.  Ask your health care provider for help if you need support or information about quitting drugs. Alcohol use  Do not drink alcohol if: ? Your health care provider tells you not to drink. ? You are pregnant, may be pregnant, or are planning to become pregnant.  If you drink alcohol: ? Limit how much you use to 0-1 drink a day. ? Limit intake if you are breastfeeding.  Be aware of how much alcohol is in your drink. In the [place] one drink equals one 12 oz bottle of beer (355 mL), one 5 oz glass of wine (148 mL), or one 1 oz glass of hard liquor (44 mL). General instructions  Schedule regular health, dental, and eye exams.  Stay current with your vaccines.  Tell your health care provider if: ? You often feel depressed. ? You have ever been abused or do not feel safe at home. Summary  Adopting a healthy lifestyle and getting preventive care are important in promoting health and wellness.  Follow your health care provider's instructions about healthy  diet, exercising, and getting tested or screened for diseases.  Follow your health care provider's instructions on monitoring your cholesterol and blood pressure. This information is not intended to replace advice given to you by your health care provider. Make sure you discuss any questions you have with your health care provider. Document Revised: 08/27/2018 Document Reviewed: 08/27/2018 Elsevier Patient Education  2021 [employer]  

## 2020-12-05 NOTE — Progress Notes (Signed)
Gynecology Annual Exam  PCP: Farrel Conners, CNM (Inactive)  Chief Complaint:  Chief Complaint  Patient presents with  . Annual Exam    History of Present Illness:Patient is a 60 y.o. G2P2002 presents for annual exam. The patient has no gyn complaints today. She has occasional hot flashes and uses OTC Amberen with good relief. She has some vaginal dryness/pain with intercourse. She cannot remember to use premarin regularly. She uses lubricant for IC which helps some.   LMP: No LMP recorded. Patient is postmenopausal.  Postcoital Bleeding: no  The patient is sexually active. She admits to dyspareunia.  The patient does perform self breast exams.  There is no notable family history of breast or ovarian cancer in her family.  The patient wears seatbelts: yes.   The patient has regular exercise: she does a dance fitness routine, she admits healthy lifestyle; diet, hydration, sleep.    The patient denies current symptoms of depression.  She has occasional anxiety for which she takes xanax.   Review of Systems: Review of Systems  Constitutional: Negative for chills and fever.  HENT: Negative for congestion, ear discharge, ear pain, hearing loss, sinus pain and sore throat.   Eyes: Negative for blurred vision and double vision.  Respiratory: Negative for cough, shortness of breath and wheezing.   Cardiovascular: Negative for chest pain, palpitations and leg swelling.  Gastrointestinal: Negative for abdominal pain, blood in stool, constipation, diarrhea, heartburn, melena, nausea and vomiting.  Genitourinary: Negative for dysuria, flank pain, frequency, hematuria and urgency.  Musculoskeletal: Positive for joint pain. Negative for back pain and myalgias.  Skin: Negative for itching and rash.  Neurological: Negative for dizziness, tingling, tremors, sensory change, speech change, focal weakness, seizures, loss of consciousness, weakness and headaches.  Endo/Heme/Allergies: Positive  for environmental allergies. Does not bruise/bleed easily.  Psychiatric/Behavioral: Negative for depression, hallucinations, memory loss, substance abuse and suicidal ideas. The patient is not nervous/anxious and does not have insomnia.        Positive for anxiety and depression    Past Medical History:  Patient Active Problem List   Diagnosis Date Noted  . Hyperlipidemia 11/01/2018  . Pap smear abnormality of cervix/human papillomavirus (HPV) positive 06/12/2017    Repeat Pap in 1 year.   . IBS (irritable bowel syndrome)   . Hypertension   . GERD (gastroesophageal reflux disease)   . Depression   . Anxiety   . Allergic rhinitis   . Pneumonia 08/17/2016    Past Surgical History:  Past Surgical History:  Procedure Laterality Date  . BILATERAL CARPAL TUNNEL RELEASE  March and April 2017  . BREAST BIOPSY Left 2013   benign  . BREAST SURGERY Left 2013   breast biopsy  . COLONOSCOPY  2011  . ESOPHAGOGASTRODUODENOSCOPY ENDOSCOPY  2016   GERD  . TUBAL LIGATION      Gynecologic History:  No LMP recorded. Patient is postmenopausal. Last Pap: 1 year ago Results were:  no abnormalities  Last mammogram: 1 year ago Results were: BI-RAD I  Obstetric History: [id number]  Family History:  Family History  Problem Relation Age of Onset  . Hypertension Mother   . Stroke Mother   . Hypertension Father   . Kidney Stones Father   . Uterine cancer Paternal Aunt 40  . Diabetes Maternal Grandmother   . Heart disease Maternal Grandmother   . Bladder Cancer Paternal Grandfather   . Colon cancer Paternal Grandfather 75  . Breast cancer Neg Hx  Social History:  Social History   Socioeconomic History  . Marital status: Married    Spouse name: Sam  . Number of children: 2  . Years of education: Not on file  . Highest education level: Not on file  Occupational History  . Not on file  Tobacco Use  . Smoking status: Never Smoker  . Smokeless tobacco: Never Used  Vaping Use  .  Vaping Use: Never used  Substance and Sexual Activity  . Alcohol use: Yes    Comment: 2 beers per week  . Drug use: No  . Sexual activity: Yes    Partners: Male    Birth control/protection: Post-menopausal  Other Topics Concern  . Not on file  Social History Narrative  . Not on file   Social Determinants of Health   Financial Resource Strain: Not on file  Food Insecurity: Not on file  Transportation Needs: Not on file  Physical Activity: Not on file  Stress: Not on file  Social Connections: Not on file  Intimate Partner Violence: Not on file    Allergies:  Allergies  Allergen Reactions  . Penicillin G Other (See Comments)    Taken as child ? reaction    Medications: Prior to Admission medications   Medication Sig Start Date End Date Taking? Authorizing Provider  albuterol (VENTOLIN HFA) 108 (90 Base) MCG/ACT inhaler ProAir HFA 90 mcg/actuation aerosol inhaler  INHALE 2 PUFS TWICE A DAY   Yes [provider]  ANTIFUNGAL 2 % cream APPLY THIN LAYER OF CREAM TO AFFECTED AREAS BILATERAL AXILLARY GROIN AND UPPER THIGHS TWICE A DAY 09/03/18  Yes [provider]  atorvastatin (LIPITOR) 20 MG tablet  09/23/18  Yes [provider]  buPROPion (ZYBAN) 150 MG 12 hr tablet Take 150 mg by mouth 2 (two) times daily.   Yes [provider]  clindamycin (CLEOCIN T) 1 % external solution  10/30/18  Yes [provider]  conjugated estrogens (PREMARIN) vaginal cream 0.5 gram vaginally nightly at bedtime for 2 weeks, then 0.5 gram vaginally three times weekly 10/31/18  Yes Farrel Conners, CNM  fexofenadine (ALLEGRA) 180 MG tablet Take 180 mg by mouth daily.   Yes [provider]  fluticasone Aleda Grana) 50 MCG/ACT nasal spray  04/14/17  Yes [provider]  meloxicam (MOBIC) 15 MG tablet meloxicam 15 mg tablet   Yes [provider]  OXISTAT 1 % lotion APPLY TOPICALLY TO THE AFFECTED AREA EVERY DAY 10/02/18  Yes [provider]  valsartan-hydrochlorothiazide (DIOVAN-HCT) 160-25 MG tablet valsartan 160 mg-hydrochlorothiazide 25 mg tablet  TAKE 1 TABLET EVERY DAY   Yes [provider]  ALPRAZolam (XANAX) 0.5 MG tablet Take 1 tablet (0.5 mg total) by mouth 2 (two) times daily as needed for anxiety. 12/05/20   Tresea Mall, CNM  montelukast (SINGULAIR) 10 MG tablet montelukast 10 mg tablet    [provider]  promethazine (PHENERGAN) 6.25 MG/5ML syrup TAKE 20 MILLILITER BY ORAL ROUTE EVERY DAY AT BEDTIME AS NEEDED 08/18/20   [provider]  valACYclovir (VALTREX) 1000 MG tablet valacyclovir 1 gram tablet  TAKE 1 TABLET BY MOUTH THREE TIMES A DAY AS NEEDED    [provider]    Physical Exam Vitals: Blood pressure 122/74, height 5\' 2"  (1.575 m), weight 117 lb (53.1 kg).  General: NAD HEENT: normocephalic, anicteric Thyroid: no enlargement, no palpable nodules Pulmonary: No increased work of breathing, CTAB Cardiovascular: RRR, distal pulses 2+ Breast: Breast symmetrical, no tenderness, no palpable nodules or  masses, no skin or nipple retraction present, no nipple discharge.  No axillary or supraclavicular lymphadenopathy. Abdomen: NABS, soft, non-tender, non-distended.  Umbilicus without lesions.  No hepatomegaly, splenomegaly or masses palpable. No evidence of hernia  Genitourinary: deferred for no concerns/PAP interval Extremities: no edema, erythema, or tenderness Neurologic: Grossly intact Psychiatric: mood appropriate, affect full    Assessment: 60 y.o. G2P2002 routine annual exam  Plan: Problem List Items Addressed This Visit      Other   Anxiety   Relevant Medications   ALPRAZolam (XANAX) 0.5 MG tablet    Other Visit Diagnoses    Well woman exam without gynecological exam    -  Primary   Relevant Orders   MM DIGITAL SCREENING BILATERAL   Ambulatory referral to Gastroenterology   Breast screening       Relevant Orders   MM DIGITAL SCREENING  BILATERAL   Colon cancer screening       Relevant Orders   Ambulatory referral to Gastroenterology      1) Mammogram - recommend yearly screening mammogram.  Mammogram was ordered today  2) STI screening  was offered and declined  3) ASCCP guidelines and rationale discussed.  Patient opts for every 3 years screening interval. PAP due 2024  4) Osteoporosis  - per USPTF routine screening DEXA at age 52  Consider FDA-approved medical therapies in postmenopausal women and men aged 24 years and older, based on the following: a) A hip or vertebral (clinical or morphometric) fracture b) T-score ? -2.5 at the femoral neck or spine after appropriate evaluation to exclude secondary causes C) Low bone mass (T-score between -1.0 and -2.5 at the femoral neck or spine) and a 10-year probability of a hip fracture ? 3% or a 10-year probability of a major osteoporosis-related fracture ? 20% based on the [place]-adapted WHO algorithm   5) Routine healthcare maintenance including cholesterol, diabetes screening discussed managed by PCP  6) Colonoscopy was ordered today- referral to [facility] (insurance coverage).  Screening recommended starting at age 55 for average risk individuals, age 38 for individuals deemed at increased risk (including African Americans) and recommended to continue until age 2.  For patient age 72-85 individualized approach is recommended.  Gold standard screening is via colonoscopy, Cologuard screening is an acceptable alternative for patient unwilling or unable to undergo colonoscopy.  "Colorectal cancer screening for average?risk adults: 2018 guideline update from the American Cancer Society"CA: A Cancer Journal for Clinicians: Feb 13, 2017   7) Return in about 1 year (around 12/05/2021) for annual established gyn.    Parke Poisson, CNM [facility] 12/05/20, 2:36 PM

## 2020-12-30 ENCOUNTER — Other Ambulatory Visit: Payer: Self-pay | Admitting: Advanced Practice Midwife

## 2020-12-30 DIAGNOSIS — Z0000 Encounter for general adult medical examination without abnormal findings: Secondary | ICD-10-CM

## 2020-12-30 DIAGNOSIS — Z1239 Encounter for other screening for malignant neoplasm of breast: Secondary | ICD-10-CM

## 2021-01-04 ENCOUNTER — Ambulatory Visit
Admission: RE | Admit: 2021-01-04 | Discharge: 2021-01-04 | Disposition: A | Discharge status: Home / Self Care | Payer: Commercial Managed Care - PPO | Source: Ambulatory Visit | Attending: Advanced Practice Midwife | Admitting: Advanced Practice Midwife

## 2021-01-04 ENCOUNTER — Other Ambulatory Visit: Payer: Self-pay

## 2021-01-04 DIAGNOSIS — Z1231 Encounter for screening mammogram for malignant neoplasm of breast: Secondary | ICD-10-CM | POA: Diagnosis not present

## 2021-01-04 DIAGNOSIS — Z1239 Encounter for other screening for malignant neoplasm of breast: Secondary | ICD-10-CM

## 2021-01-04 DIAGNOSIS — Z0000 Encounter for general adult medical examination without abnormal findings: Secondary | ICD-10-CM | POA: Diagnosis not present

## 2021-12-26 ENCOUNTER — Other Ambulatory Visit: Payer: Self-pay | Admitting: Advanced Practice Midwife

## 2021-12-26 ENCOUNTER — Other Ambulatory Visit: Payer: Self-pay | Admitting: Pediatrics

## 2021-12-26 DIAGNOSIS — Z1231 Encounter for screening mammogram for malignant neoplasm of breast: Secondary | ICD-10-CM

## 2022-01-02 DIAGNOSIS — M545 Low back pain, unspecified: Secondary | ICD-10-CM | POA: Insufficient documentation

## 2022-02-01 ENCOUNTER — Ambulatory Visit
Admission: RE | Admit: 2022-02-01 | Discharge: 2022-02-01 | Disposition: A | Discharge status: Home / Self Care | Payer: Commercial Managed Care - PPO | Source: Ambulatory Visit | Attending: Pediatrics | Admitting: Pediatrics

## 2022-02-01 DIAGNOSIS — Z1231 Encounter for screening mammogram for malignant neoplasm of breast: Secondary | ICD-10-CM | POA: Insufficient documentation

## 2022-02-01 DIAGNOSIS — M5416 Radiculopathy, lumbar region: Secondary | ICD-10-CM | POA: Insufficient documentation

## 2023-01-08 ENCOUNTER — Other Ambulatory Visit: Payer: Self-pay | Admitting: Orthopedic Surgery

## 2023-01-08 DIAGNOSIS — M5416 Radiculopathy, lumbar region: Secondary | ICD-10-CM

## 2023-01-18 ENCOUNTER — Ambulatory Visit: Payer: Commercial Managed Care - PPO

## 2023-02-05 ENCOUNTER — Other Ambulatory Visit (HOSPITAL_COMMUNITY)
Admission: RE | Admit: 2023-02-05 | Discharge: 2023-02-05 | Disposition: A | Discharge status: Home / Self Care | Payer: Commercial Managed Care - PPO | Source: Ambulatory Visit | Attending: Advanced Practice Midwife | Admitting: Advanced Practice Midwife

## 2023-02-05 ENCOUNTER — Ambulatory Visit (INDEPENDENT_AMBULATORY_CARE_PROVIDER_SITE_OTHER): Payer: Commercial Managed Care - PPO | Admitting: Advanced Practice Midwife

## 2023-02-05 ENCOUNTER — Other Ambulatory Visit: Payer: Self-pay | Admitting: Advanced Practice Midwife

## 2023-02-05 ENCOUNTER — Encounter: Payer: Self-pay | Admitting: Advanced Practice Midwife

## 2023-02-05 VITALS — BP 130/80 | Ht 62.0 in | Wt 111.0 lb

## 2023-02-05 DIAGNOSIS — Z01419 Encounter for gynecological examination (general) (routine) without abnormal findings: Secondary | ICD-10-CM | POA: Diagnosis present

## 2023-02-05 DIAGNOSIS — Z124 Encounter for screening for malignant neoplasm of cervix: Secondary | ICD-10-CM | POA: Diagnosis present

## 2023-02-05 DIAGNOSIS — Z1231 Encounter for screening mammogram for malignant neoplasm of breast: Secondary | ICD-10-CM

## 2023-02-05 DIAGNOSIS — Z13220 Encounter for screening for lipoid disorders: Secondary | ICD-10-CM

## 2023-02-05 DIAGNOSIS — Z131 Encounter for screening for diabetes mellitus: Secondary | ICD-10-CM

## 2023-02-05 NOTE — Progress Notes (Signed)
Gynecology Annual Exam  PCP: Tresea Mall, CNM  Chief Complaint:  Chief Complaint  Patient presents with   Annual Exam    History of Present Illness: Patient is a 62 y.o. G2P2002 presents for annual exam. The patient has complaints today of ongoing hot flashes and pain with intercourse. She continues to use Amberen OTC with good relief for hot flashes. She is not using premarin and uses lubrication during intercourse with some relief. Suggested she try Hyaluronic acid vaginal preparation OTC.   LMP: No LMP recorded. Patient is postmenopausal.   The patient is sexually active.  She has dyspareunia.  The patient does perform self breast exams.  There is no notable family history of breast or ovarian cancer in her family.  The patient wears seatbelts: yes.   The patient has regular exercise:  she has some limitations due to back injury/pain, however, she still does some strength and stretching and dance work out. She admits healthy diet and adequate hydration. She usually has 6-7 hours of sleep .    The patient denies current symptoms of depression.    Review of Systems: Review of Systems  Constitutional:  Negative for chills and fever.  HENT:  Positive for congestion. Negative for ear discharge, ear pain, hearing loss, sinus pain and sore throat.   Eyes:  Negative for blurred vision and double vision.  Respiratory:  Negative for cough, shortness of breath and wheezing.   Cardiovascular:  Negative for chest pain, palpitations and leg swelling.  Gastrointestinal:  Negative for abdominal pain, blood in stool, constipation, diarrhea, heartburn, melena, nausea and vomiting.  Genitourinary:  Negative for dysuria, flank pain, frequency, hematuria and urgency.  Musculoskeletal:  Positive for back pain and joint pain. Negative for myalgias.  Skin:  Negative for itching and rash.  Neurological:  Negative for dizziness, tingling, tremors, sensory change, speech change, focal weakness,  seizures, loss of consciousness, weakness and headaches.  Endo/Heme/Allergies:  Positive for environmental allergies. Does not bruise/bleed easily.       Positive for hot flashes  Psychiatric/Behavioral:  Positive for depression. Negative for hallucinations, memory loss, substance abuse and suicidal ideas. The patient is not nervous/anxious and does not have insomnia.        Positive for anxiety    Past Medical History:  Patient Active Problem List   Diagnosis Date Noted   Hyperlipidemia 11/01/2018   Pap smear abnormality of cervix/human papillomavirus (HPV) positive 06/12/2017    Repeat Pap in 1 year.    IBS (irritable bowel syndrome)    Hypertension    GERD (gastroesophageal reflux disease)    Depression    Anxiety    Allergic rhinitis    Pneumonia 08/17/2016    Past Surgical History:  Past Surgical History:  Procedure Laterality Date   BILATERAL CARPAL TUNNEL RELEASE  March and April 2017   BREAST BIOPSY Left 2013   benign   BREAST SURGERY Left 2013   breast biopsy   COLONOSCOPY  2011   ESOPHAGOGASTRODUODENOSCOPY ENDOSCOPY  2016   GERD   TUBAL LIGATION      Gynecologic History:  No LMP recorded. Patient is postmenopausal.  Last Pap: 2021 Results were:  no abnormalities (some inflammation noted) Last mammogram: 02/01/2022 Results were: BI-RAD I  Obstetric History: [id number]  Family History:  Family History  Problem Relation Age of Onset   Hypertension Mother    Stroke Mother    Hypertension Father    Kidney Stones Father    Uterine cancer Paternal Aunt  40   Diabetes Maternal Grandmother    Heart disease Maternal Grandmother    Bladder Cancer Paternal Grandfather    Colon cancer Paternal Grandfather 48   Breast cancer Neg Hx     Social History:  Social History   Socioeconomic History   Marital status: Married    Spouse name: Sam   Number of children: 2   Years of education: Not on file   Highest education level: Not on file  Occupational History    Not on file  Tobacco Use   Smoking status: Never   Smokeless tobacco: Never  Vaping Use   Vaping Use: Never used  Substance and Sexual Activity   Alcohol use: Yes    Comment: 2 beers per week   Drug use: No   Sexual activity: Yes    Partners: Male    Birth control/protection: Post-menopausal  Other Topics Concern   Not on file  Social History Narrative   Not on file   Social Determinants of Health   Financial Resource Strain: Not on file  Food Insecurity: Not on file  Transportation Needs: Not on file  Physical Activity: Not on file  Stress: Not on file  Social Connections: Not on file  Intimate Partner Violence: Not on file    Allergies:  Allergies  Allergen Reactions   Penicillin G Other (See Comments)    Taken as child ? reaction    Medications: Prior to Admission medications   Medication Sig Start Date End Date Taking? Authorizing Provider  albuterol (VENTOLIN HFA) 108 (90 Base) MCG/ACT inhaler ProAir HFA 90 mcg/actuation aerosol inhaler  INHALE 2 PUFS TWICE A DAY   Yes [provider]  ALPRAZolam (XANAX) 0.5 MG tablet Take 1 tablet (0.5 mg total) by mouth 2 (two) times daily as needed for anxiety. 12/05/20  Yes Tresea Mall, CNM  amLODipine (NORVASC) 5 MG tablet Take 1 tablet by mouth daily.   Yes [provider]  atorvastatin (LIPITOR) 20 MG tablet  09/23/18  Yes [provider]  buPROPion (ZYBAN) 150 MG 12 hr tablet Take 150 mg by mouth 2 (two) times daily.   Yes [provider]  fexofenadine (ALLEGRA) 180 MG tablet Take 180 mg by mouth daily.   Yes [provider]  fluticasone Aleda Grana) 50 MCG/ACT nasal spray  04/14/17  Yes [provider]  meloxicam (MOBIC) 15 MG tablet meloxicam 15 mg tablet   Yes [provider]  montelukast (SINGULAIR) 10 MG tablet montelukast 10 mg tablet   Yes [provider]  valACYclovir (VALTREX) 1000 MG tablet valacyclovir 1 gram tablet  TAKE 1 TABLET BY MOUTH  THREE TIMES A DAY AS NEEDED   Yes [provider]  valsartan-hydrochlorothiazide (DIOVAN-HCT) 160-25 MG tablet valsartan 160 mg-hydrochlorothiazide 25 mg tablet  TAKE 1 TABLET EVERY DAY   Yes [provider]  ANTIFUNGAL 2 % cream APPLY THIN LAYER OF CREAM TO AFFECTED AREAS BILATERAL AXILLARY GROIN AND UPPER THIGHS TWICE A DAY Patient not taking: Reported on 02/05/2023 09/03/18   [provider]  benzonatate (TESSALON) 200 MG capsule benzonatate 200 mg capsule  TAKE 1 CAPSULE BY MOUTH THREE TIMES A DAY AS NEEDED FOR COUGH    [provider]  clindamycin (CLEOCIN T) 1 % external solution  10/30/18   [provider]  conjugated estrogens (PREMARIN) vaginal cream 0.5 gram vaginally nightly at bedtime for 2 weeks, then 0.5 gram vaginally three times weekly Patient not taking: Reported on 02/05/2023 10/31/18   Farrel Conners,  CNM  OXISTAT 1 % lotion APPLY TOPICALLY TO THE AFFECTED AREA EVERY DAY Patient not taking: Reported on 02/05/2023 10/02/18   [provider]  promethazine (PHENERGAN) 6.25 MG/5ML syrup TAKE 20 MILLILITER BY ORAL ROUTE EVERY DAY AT BEDTIME AS NEEDED Patient not taking: Reported on 02/05/2023 08/18/20   [provider]    Physical Exam Vitals: Blood pressure 130/80, height 5\' 2"  (1.575 m), weight 111 lb (50.3 kg).  General: NAD HEENT: normocephalic, anicteric Thyroid: no enlargement, no palpable nodules Pulmonary: No increased work of breathing, CTAB Cardiovascular: RRR, distal pulses 2+ Breast: Breast symmetrical, no tenderness, no palpable nodules or masses, no skin or nipple retraction present, no nipple discharge.  No axillary or supraclavicular lymphadenopathy. Abdomen: NABS, soft, non-tender, non-distended.  Umbilicus without lesions.  No hepatomegaly, splenomegaly or masses palpable. No evidence of hernia  Genitourinary:  External: Normal external female genitalia.  Normal urethral meatus, normal Bartholin's  and Skene's glands.    Vagina: Normal vaginal mucosa, no evidence of prolapse.    Cervix: Grossly normal in appearance, no bleeding  Uterus: Non-enlarged, mobile, normal contour.  No CMT  Adnexa: ovaries non-enlarged, no adnexal masses  Rectal: deferred  Lymphatic: no evidence of inguinal lymphadenopathy Extremities: no edema, erythema, or tenderness Neurologic: Grossly intact Psychiatric: mood appropriate, affect full   Assessment: 62 y.o. G2P2002 routine annual exam  Plan: Problem List Items Addressed This Visit   None Visit Diagnoses     Well woman exam with routine gynecological exam    -  Primary   Relevant Orders   Cytology - PAP   CBC with Differential/Platelet   Comprehensive metabolic panel   Lipid Panel With LDL/HDL Ratio   TSH   Hgb Z6X w/o eAG   MM 3D SCREENING MAMMOGRAM BILATERAL BREAST   Lipid screening       Relevant Orders   Lipid Panel With LDL/HDL Ratio   Screening for diabetes mellitus       Relevant Orders   Hgb A1c w/o eAG   Screening for cervical cancer       Relevant Orders   Cytology - PAP   Breast cancer screening by mammogram       Relevant Orders   MM 3D SCREENING MAMMOGRAM BILATERAL BREAST       1) Mammogram - recommend yearly screening mammogram.  Mammogram Was ordered today  2) STI screening  was offered and declined  3) ASCCP guidelines and rationale discussed.  Patient opts for every 5 years screening interval  4) Contraception - the patient is postmenopausal  5) Colonoscopy: done in 2022 at [facility] facility -- Screening recommended starting at age 55 for average risk individuals, age 68 for individuals deemed at increased risk (including African Americans) and recommended to continue until age 30.  For patient age 69-85 individualized approach is recommended.  Gold standard screening is via colonoscopy, Cologuard screening is an acceptable alternative for patient unwilling or unable to undergo colonoscopy.  "Colorectal cancer screening  for average?risk adults: 2018 guideline update from the American Cancer Society"CA: A Cancer Journal for Clinicians: Feb 13, 2017   6) Routine healthcare maintenance including cholesterol, diabetes screening discussed Ordered today  7) Return in about 1 year (around 02/05/2024) for annual established gyn.   Tresea Mall, CNM [facility] 02/05/2023 9:30 AM

## 2023-02-05 NOTE — Patient Instructions (Signed)
Mediterranean Diet ?A Mediterranean diet refers to food and lifestyle choices that are based on the traditions of countries located on the [place] Sea. It focuses on eating more fruits, vegetables, whole grains, beans, nuts, seeds, and heart-healthy fats, and eating less dairy, meat, eggs, and processed foods with added sugar, salt, and fat. This way of eating has been shown to help prevent certain conditions and improve outcomes for people who have chronic diseases, like kidney disease and heart disease. ?What are tips for following this plan? ?Reading food labels ?Check the serving size of packaged foods. For foods such as rice and pasta, the serving size refers to the amount of cooked product, not dry. ?Check the total fat in packaged foods. Avoid foods that have saturated fat or trans fats. ?Check the ingredient list for added sugars, such as corn syrup. ?Shopping ? ?Buy a variety of foods that offer a balanced diet, including: ?Fresh fruits and vegetables (produce). ?Grains, beans, nuts, and seeds. Some of these may be available in unpackaged forms or large amounts (in bulk). ?Fresh seafood. ?Poultry and eggs. ?Low-fat dairy products. ?Buy whole ingredients instead of prepackaged foods. ?Buy fresh fruits and vegetables in-season from local farmers markets. ?Buy plain frozen fruits and vegetables. ?If you do not have access to quality fresh seafood, buy precooked frozen shrimp or canned fish, such as tuna, salmon, or sardines. ?Stock your pantry so you always have certain foods on hand, such as olive oil, canned tuna, canned tomatoes, rice, pasta, and beans. ?Cooking ?Cook foods with extra-virgin olive oil instead of using butter or other vegetable oils. ?Have meat as a side dish, and have vegetables or grains as your main dish. This means having meat in small portions or adding small amounts of meat to foods like pasta or stew. ?Use beans or vegetables instead of meat in common dishes like chili or  lasagna. ?Experiment with different cooking methods. Try roasting, broiling, steaming, and saut?ing vegetables. ?Add frozen vegetables to soups, stews, pasta, or rice. ?Add nuts or seeds for added healthy fats and plant protein at each meal. You can add these to yogurt, salads, or vegetable dishes. ?Marinate fish or vegetables using olive oil, lemon juice, garlic, and fresh herbs. ?Meal planning ?Plan to eat one vegetarian meal one day each week. Try to work up to two vegetarian meals, if possible. ?Eat seafood two or more times a week. ?Have healthy snacks readily available, such as: ?Vegetable sticks with hummus. ?Greek yogurt. ?Fruit and nut trail mix. ?Eat balanced meals throughout the week. This includes: ?Fruit: 2-3 servings a day. ?Vegetables: 4-5 servings a day. ?Low-fat dairy: 2 servings a day. ?Fish, poultry, or lean meat: 1 serving a day. ?Beans and legumes: 2 or more servings a week. ?Nuts and seeds: 1-2 servings a day. ?Whole grains: 6-8 servings a day. ?Extra-virgin olive oil: 3-4 servings a day. ?Limit red meat and sweets to only a few servings a month. ?Lifestyle ? ?Cook and eat meals together with your family, when possible. ?Drink enough fluid to keep your urine pale yellow. ?Be physically active every day. This includes: ?Aerobic exercise like running or swimming. ?Leisure activities like gardening, walking, or housework. ?Get 7-8 hours of sleep each night. ?If recommended by your health care provider, drink red wine in moderation. This means 1 glass a day for nonpregnant women and 2 glasses a day for men. A glass of wine equals 5 oz (150 mL). ?What foods should I eat? ?Fruits ?Apples. Apricots. Avocado. Berries. Bananas. Cherries. Dates.   Figs. Grapes. Lemons. Melon. Oranges. Peaches. Plums. Pomegranate. ?Vegetables ?Artichokes. Beets. Broccoli. Cabbage. Carrots. Eggplant. Green beans. Chard. Kale. Spinach. Onions. Leeks. Peas. Squash. Tomatoes. Peppers. Radishes. ?Grains ?Whole-grain pasta. Brown  rice. Bulgur wheat. Polenta. Couscous. Whole-wheat bread. Oatmeal. Quinoa. ?Meats and other proteins ?Beans. Almonds. Sunflower seeds. Pine nuts. Peanuts. Cod. Salmon. Scallops. Shrimp. Tuna. Tilapia. Clams. Oysters. Eggs. Poultry without skin. ?Dairy ?Low-fat milk. Cheese. Greek yogurt. ?Fats and oils ?Extra-virgin olive oil. Avocado oil. Grapeseed oil. ?Beverages ?Water. Red wine. Herbal tea. ?Sweets and desserts ?Greek yogurt with honey. Baked apples. Poached pears. Trail mix. ?Seasonings and condiments ?Basil. Cilantro. Coriander. Cumin. Mint. Parsley. Sage. Rosemary. Tarragon. Garlic. Oregano. Thyme. Pepper. Balsamic vinegar. Tahini. Hummus. Tomato sauce. Olives. Mushrooms. ?The items listed above may not be a complete list of foods and beverages you can eat. Contact a dietitian for more information. ?What foods should I limit? ?This is a list of foods that should be eaten rarely or only on special occasions. ?Fruits ?Fruit canned in syrup. ?Vegetables ?Deep-fried potatoes (french fries). ?Grains ?Prepackaged pasta or rice dishes. Prepackaged cereal with added sugar. Prepackaged snacks with added sugar. ?Meats and other proteins ?Beef. Pork. Lamb. Poultry with skin. Hot dogs. Bacon. ?Dairy ?Ice cream. Sour cream. Whole milk. ?Fats and oils ?Butter. Canola oil. Vegetable oil. Beef fat (tallow). Lard. ?Beverages ?Juice. Sugar-sweetened soft drinks. Beer. Liquor and spirits. ?Sweets and desserts ?Cookies. Cakes. Pies. Candy. ?Seasonings and condiments ?Mayonnaise. Pre-made sauces and marinades. ?The items listed above may not be a complete list of foods and beverages you should limit. Contact a dietitian for more information. ?Summary ?The Mediterranean diet includes both food and lifestyle choices. ?Eat a variety of fresh fruits and vegetables, beans, nuts, seeds, and whole grains. ?Limit the amount of red meat and sweets that you eat. ?If recommended by your health care provider, drink red wine in moderation.  This means 1 glass a day for nonpregnant women and 2 glasses a day for men. A glass of wine equals 5 oz (150 mL). ?This information is not intended to replace advice given to you by your health care provider. Make sure you discuss any questions you have with your health care provider. ?Document Revised: 10/09/2019 Document Reviewed: 08/06/2019 ?Elsevier Patient Education ? 2023 Elsevier Inc. ? ?

## 2023-02-06 LAB — CBC WITH DIFFERENTIAL/PLATELET
Basophils Absolute: 0 10*3/uL (ref 0.0–0.2)
Basos: 1 %
EOS (ABSOLUTE): 0.1 10*3/uL (ref 0.0–0.4)
Eos: 2 %
Hematocrit: 38.7 % (ref 34.0–46.6)
Hemoglobin: 12.4 g/dL (ref 11.1–15.9)
Immature Grans (Abs): 0 10*3/uL (ref 0.0–0.1)
Immature Granulocytes: 0 %
Lymphocytes Absolute: 1.5 10*3/uL (ref 0.7–3.1)
Lymphs: 26 %
MCH: 29.7 pg (ref 26.6–33.0)
MCHC: 32 g/dL (ref 31.5–35.7)
MCV: 93 fL (ref 79–97)
Monocytes Absolute: 0.6 10*3/uL (ref 0.1–0.9)
Monocytes: 10 %
Neutrophils Absolute: 3.5 10*3/uL (ref 1.4–7.0)
Neutrophils: 61 %
Platelets: 424 10*3/uL (ref 150–450)
RBC: 4.18 x10E6/uL (ref 3.77–5.28)
RDW: 13.1 % (ref 11.7–15.4)
WBC: 5.7 10*3/uL (ref 3.4–10.8)

## 2023-02-06 LAB — COMPREHENSIVE METABOLIC PANEL
ALT: 24 IU/L (ref 0–32)
AST: 29 IU/L (ref 0–40)
Albumin/Globulin Ratio: 2.2 (ref 1.2–2.2)
Albumin: 4.6 g/dL (ref 3.8–4.9)
Alkaline Phosphatase: 78 IU/L (ref 44–121)
BUN/Creatinine Ratio: 20 (ref 9–23)
BUN: 16 mg/dL (ref 6–24)
Bilirubin Total: 0.4 mg/dL (ref 0.0–1.2)
CO2: 24 mmol/L (ref 20–29)
Calcium: 9.8 mg/dL (ref 8.7–10.2)
Chloride: 100 mmol/L (ref 96–106)
Creatinine, Ser: 0.81 mg/dL (ref 0.57–1.00)
Globulin, Total: 2.1 g/dL (ref 1.5–4.5)
Glucose: 79 mg/dL (ref 70–99)
Potassium: 4.1 mmol/L (ref 3.5–5.2)
Sodium: 138 mmol/L (ref 134–144)
Total Protein: 6.7 g/dL (ref 6.0–8.5)
eGFR: 84 mL/min/{1.73_m2} (ref >59)

## 2023-02-06 LAB — LIPID PANEL WITH LDL/HDL RATIO
Cholesterol, Total: 156 mg/dL (ref 100–199)
HDL: 61 mg/dL (ref >39)
LDL Chol Calc (NIH): 82 mg/dL (ref 0–99)
LDL/HDL Ratio: 1.3 ratio (ref 0.0–3.2)
Triglycerides: 65 mg/dL (ref 0–149)
VLDL Cholesterol Cal: 13 mg/dL (ref 5–40)

## 2023-02-06 LAB — TSH: TSH: 1.12 u[IU]/mL (ref 0.450–4.500)

## 2023-02-06 LAB — HGB A1C W/O EAG: Hgb A1c MFr Bld: 5.8 % — ABNORMAL HIGH (ref 4.8–5.6)

## 2023-02-12 LAB — CYTOLOGY - PAP
Comment: NEGATIVE
Diagnosis: NEGATIVE
High risk HPV: NEGATIVE

## 2023-04-10 ENCOUNTER — Ambulatory Visit
Admission: RE | Admit: 2023-04-10 | Discharge: 2023-04-10 | Disposition: A | Discharge status: Home / Self Care | Payer: Commercial Managed Care - PPO | Source: Ambulatory Visit | Attending: Advanced Practice Midwife | Admitting: Advanced Practice Midwife

## 2023-04-10 DIAGNOSIS — Z01419 Encounter for gynecological examination (general) (routine) without abnormal findings: Secondary | ICD-10-CM

## 2023-04-10 DIAGNOSIS — Z1231 Encounter for screening mammogram for malignant neoplasm of breast: Secondary | ICD-10-CM | POA: Insufficient documentation

## 2023-04-15 ENCOUNTER — Ambulatory Visit: Payer: Commercial Managed Care - PPO

## 2023-06-24 ENCOUNTER — Ambulatory Visit: Payer: Commercial Managed Care - PPO

## 2023-08-12 ENCOUNTER — Ambulatory Visit: Payer: Self-pay | Admitting: Physician Assistant

## 2023-08-22 ENCOUNTER — Ambulatory Visit: Payer: Commercial Managed Care - PPO | Admitting: Family Medicine

## 2023-08-22 ENCOUNTER — Encounter: Payer: Self-pay | Admitting: Family Medicine

## 2023-08-22 VITALS — BP 138/79 | HR 84 | Temp 99.0°F | Ht 62.0 in | Wt 113.0 lb

## 2023-08-22 DIAGNOSIS — E785 Hyperlipidemia, unspecified: Secondary | ICD-10-CM

## 2023-08-22 DIAGNOSIS — F419 Anxiety disorder, unspecified: Secondary | ICD-10-CM | POA: Diagnosis not present

## 2023-08-22 DIAGNOSIS — J302 Other seasonal allergic rhinitis: Secondary | ICD-10-CM

## 2023-08-22 DIAGNOSIS — Z87898 Personal history of other specified conditions: Secondary | ICD-10-CM | POA: Diagnosis not present

## 2023-08-22 DIAGNOSIS — I10 Essential (primary) hypertension: Secondary | ICD-10-CM

## 2023-08-22 DIAGNOSIS — Z23 Encounter for immunization: Secondary | ICD-10-CM

## 2023-08-22 DIAGNOSIS — Z7689 Persons encountering health services in other specified circumstances: Secondary | ICD-10-CM | POA: Insufficient documentation

## 2023-08-22 DIAGNOSIS — Z114 Encounter for screening for human immunodeficiency virus [HIV]: Secondary | ICD-10-CM

## 2023-08-22 DIAGNOSIS — Z1159 Encounter for screening for other viral diseases: Secondary | ICD-10-CM

## 2023-08-22 DIAGNOSIS — F339 Major depressive disorder, recurrent, unspecified: Secondary | ICD-10-CM

## 2023-08-22 DIAGNOSIS — J4520 Mild intermittent asthma, uncomplicated: Secondary | ICD-10-CM

## 2023-08-22 MED ORDER — ATORVASTATIN CALCIUM 20 MG PO TABS: 20.0000 mg | ORAL_TABLET | Freq: Every day | ORAL | 6 refills | Status: DC

## 2023-08-22 MED ORDER — ALPRAZOLAM 0.5 MG PO TABS: 0.5000 mg | ORAL_TABLET | Freq: Two times a day (BID) | ORAL | 0 refills | Status: DC | PRN

## 2023-08-22 MED ORDER — AMLODIPINE BESYLATE 5 MG PO TABS: 5.0000 mg | ORAL_TABLET | Freq: Every day | ORAL | 6 refills | Status: DC

## 2023-08-22 MED ORDER — BUPROPION HCL ER (XL) 300 MG PO TB24: 300.0000 mg | ORAL_TABLET | Freq: Every day | ORAL | 6 refills | Status: DC

## 2023-08-22 MED ORDER — FLUTICASONE PROPIONATE 50 MCG/ACT NA SUSP: 1.0000 | Freq: Two times a day (BID) | NASAL | 6 refills | Status: DC | PRN

## 2023-08-22 MED ORDER — MONTELUKAST SODIUM 10 MG PO TABS: 10.0000 mg | ORAL_TABLET | Freq: Every evening | ORAL | 6 refills | Status: DC

## 2023-08-22 MED ORDER — ALBUTEROL SULFATE HFA 108 (90 BASE) MCG/ACT IN AERS: 1.0000 | INHALATION_SPRAY | Freq: Four times a day (QID) | RESPIRATORY_TRACT | 3 refills | Status: DC | PRN

## 2023-08-22 MED ORDER — VALSARTAN-HYDROCHLOROTHIAZIDE 160-25 MG PO TABS: 1.0000 | ORAL_TABLET | Freq: Every day | ORAL | 6 refills | Status: DC

## 2023-08-22 NOTE — Assessment & Plan Note (Signed)
Welcome to [facility]! Screening labs ordered.  Uptodate on Mammogram, dental and eyes.

## 2023-08-22 NOTE — Assessment & Plan Note (Addendum)
Reordered pt's bupropion - happy with her current mgmt for depression

## 2023-08-22 NOTE — Assessment & Plan Note (Signed)
Will check AIC.   Discussed keeping up with exercise, water intake, and being aware of starchy carbs, increasing fruits, veggies, protein

## 2023-08-22 NOTE — Assessment & Plan Note (Signed)
BP 138/79 today Reordered amlodipine and valsartan-hydrochlorothiazide DASH diet, monitor sodium intake, continue exercise regimen. -CMP ordered

## 2023-08-22 NOTE — Assessment & Plan Note (Addendum)
Reorded pt's as needed Flonase and nightly Singular.  No concerns during visit

## 2023-08-22 NOTE — Progress Notes (Signed)
New Patient Office Visit  Introduced to nurse practitioner role and practice setting.  All questions answered.  Discussed provider/patient relationship and expectations.   Subjective    Patient ID: Katie Gallegos, female    DOB: 10/31/1960  Age: 62 y.o. MRN: [id number]  CC:  Chief Complaint  Patient presents with   New Patient (Initial Visit)    Next week: Sees optha, Dentist, [facility] - injections -  Allergist  - endo scope  HPI Payal P Money presents to establish care. She was being seen at [facility] and [facility], but they recently closed, so she is looking for new primary care office. She lives in [place], has two adult children, and three grandchildren. 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 She is from [place], nad enjoys Zumba and her two cats. Her husband is an avid Programmer, multimedia.   Main concerns today are reordering her daily maintenance medications and increased panic anxiety.   Anxiety: Pt concerned for increased panic episodes due to declining health of parents and lack of help from sibling. This has lead to lost of sleep some nights and episodic panic attacks at home. She took xanax a few years back - which helped a lot with. Because these episodes are increasing and more frequent she was hoping to having this reordered.    Next week she has ophthalmology appointment, dentist appointment, and have been seeing [facility] for spinal injections for lumbar pain mgmt. No concerns or issues today on back pain - she is happy with their mgmt.   Has planned endoscopy at end of month for work up on potential upper GI ulcers - recommended by allergist. She is on daily omeprazole.   Outpatient Encounter Medications as of 08/22/2023  Medication Sig   ALPRAZolam (XANAX) 0.5 MG tablet Take 1 tablet (0.5 mg total) by mouth 2 (two) times daily as needed for anxiety.   fluticasone (FLONASE) 50 MCG/ACT nasal  spray Place 1-2 sprays into both nostrils 2 (two) times daily as needed for allergies or rhinitis.   omeprazole (PRILOSEC) 40 MG capsule Take 40 mg by mouth daily.   [DISCONTINUED] albuterol (VENTOLIN HFA) 108 (90 Base) MCG/ACT inhaler ProAir HFA 90 mcg/actuation aerosol inhaler  INHALE 2 PUFS TWICE A DAY   [DISCONTINUED] ALPRAZolam (XANAX) 0.5 MG tablet Take 1 tablet (0.5 mg total) by mouth 2 (two) times daily as needed for anxiety.   [DISCONTINUED] amLODipine (NORVASC) 5 MG tablet Take 1 tablet by mouth daily.   [DISCONTINUED] atorvastatin (LIPITOR) 20 MG tablet    [DISCONTINUED] buPROPion (WELLBUTRIN XL) 300 MG 24 hr tablet Take 300 mg by mouth daily.   [DISCONTINUED] fluticasone (FLONASE) 50 MCG/ACT nasal spray    [DISCONTINUED] montelukast (SINGULAIR) 10 MG tablet montelukast 10 mg tablet   [DISCONTINUED] valsartan-hydrochlorothiazide (DIOVAN-HCT) 160-25 MG tablet valsartan 160 mg-hydrochlorothiazide 25 mg tablet  TAKE 1 TABLET EVERY DAY   albuterol (VENTOLIN HFA) 108 (90 Base) MCG/ACT inhaler Inhale 1-2 puffs into the lungs every 6 (six) hours as needed for wheezing or shortness of breath.   amLODipine (NORVASC) 5 MG tablet Take 1 tablet (5 mg total) by mouth daily.   atorvastatin (LIPITOR) 20 MG tablet Take 1 tablet (20 mg total) by mouth daily.   buPROPion (WELLBUTRIN XL) 300 MG 24 hr tablet Take 1 tablet (300 mg total) by mouth daily.   fexofenadine (ALLEGRA) 180 MG tablet Take 180 mg by mouth daily. (Patient not taking: Reported on 08/22/2023)  meloxicam (MOBIC) 15 MG tablet meloxicam 15 mg tablet (Patient not taking: Reported on 08/22/2023)   montelukast (SINGULAIR) 10 MG tablet Take 1 tablet (10 mg total) by mouth at bedtime.   Multiple Vitamin (MULTIVITAMIN) capsule Take 1 capsule by mouth daily. (Patient not taking: Reported on 08/22/2023)   valACYclovir (VALTREX) 1000 MG tablet valacyclovir 1 gram tablet  TAKE 1 TABLET BY MOUTH THREE TIMES A DAY AS NEEDED (Patient not taking:  Reported on 08/22/2023)   valsartan-hydrochlorothiazide (DIOVAN-HCT) 160-25 MG tablet Take 1 tablet by mouth daily.   [DISCONTINUED] benzonatate (TESSALON) 200 MG capsule benzonatate 200 mg capsule  TAKE 1 CAPSULE BY MOUTH THREE TIMES A DAY AS NEEDED FOR COUGH (Patient not taking: Reported on 08/22/2023)   [DISCONTINUED] buPROPion (ZYBAN) 150 MG 12 hr tablet Take by mouth 2 (two) times daily. (Patient not taking: Reported on 08/22/2023)   No facility-administered encounter medications on file as of 08/22/2023.    Past Medical History:  Diagnosis Date   Allergic rhinitis    Anxiety    Carpal tunnel syndrome    Depression    GERD (gastroesophageal reflux disease)    History of mammogram 03/13/2016   negative   Hypertension    IBS (irritable bowel syndrome)    Pneumonia 08/2016    Past Surgical History:  Procedure Laterality Date   BILATERAL CARPAL TUNNEL RELEASE  March and April 2017   BREAST BIOPSY Left 2013   benign   COLONOSCOPY  2011   ESOPHAGOGASTRODUODENOSCOPY ENDOSCOPY  2016   GERD   TUBAL LIGATION      Family History  Problem Relation Age of Onset   Hypertension Mother    Stroke Mother    Hypertension Father    Kidney Stones Father    Uterine cancer Paternal Aunt 12   Diabetes Maternal Grandmother    Heart disease Maternal Grandmother    Bladder Cancer Paternal Grandfather    Colon cancer Paternal Grandfather 6   Breast cancer Neg Hx     Social History   Socioeconomic History   Marital status: Married    Spouse name: Sam   Number of children: 2   Years of education: Not on file   Highest education level: Not on file  Occupational History   Not on file  Tobacco Use   Smoking status: Never   Smokeless tobacco: Never  Vaping Use   Vaping status: Never Used  Substance and Sexual Activity   Alcohol use: Yes    Comment: 2 beers per week   Drug use: No   Sexual activity: Yes    Partners: Male    Birth control/protection: Post-menopausal  Other Topics  Concern   Not on file  Social History Narrative   Not on file   Social Determinants of Health   Financial Resource Strain: Not on file  Food Insecurity: Not on file  Transportation Needs: Not on file  Physical Activity: Not on file  Stress: Not on file  Social Connections: Not on file  Intimate Partner Violence: Not on file    Review of Systems  All other systems reviewed and are negative.    Objective    BP 138/79   Pulse 84   Temp 99 F (37.2 C)   Ht 5\' 2"  (1.575 m)   Wt 113 lb (51.3 kg)   SpO2 99%   BMI 20.67 kg/m   Physical Exam Constitutional:      Appearance: Normal appearance. She is normal weight.  HENT:  Head: Normocephalic.     Nose: Nose normal.     Mouth/Throat:     Mouth: Mucous membranes are moist.     Pharynx: Oropharynx is clear.  Eyes:     Extraocular Movements: Extraocular movements intact.     Pupils: Pupils are equal, round, and reactive to light.  Cardiovascular:     Rate and Rhythm: Normal rate and regular rhythm.     Pulses: Normal pulses.     Heart sounds: Normal heart sounds.  Pulmonary:     Effort: Pulmonary effort is normal.     Breath sounds: Normal breath sounds.  Abdominal:     General: Abdomen is flat. Bowel sounds are normal.  Musculoskeletal:        General: Normal range of motion.     Cervical back: Normal range of motion. No rigidity or tenderness.  Lymphadenopathy:     Cervical: No cervical adenopathy.  Skin:    General: Skin is warm and dry.     Capillary Refill: Capillary refill takes less than 2 seconds.  Neurological:     General: No focal deficit present.     Mental Status: She is alert and oriented to person, place, and time. Mental status is at baseline.  Psychiatric:        Mood and Affect: Mood normal.        Behavior: Behavior normal.        Thought Content: Thought content normal.        Judgment: Judgment normal.    Flowsheet Row Office Visit from 08/22/2023 in [facility]  PHQ-9 Total Score 11          08/22/2023    4:03 PM  GAD 7 : Generalized Anxiety Score  Nervous, Anxious, on Edge 1  Control/stop worrying 1  Worry too much - different things 1  Trouble relaxing 1  Restless 1  Easily annoyed or irritable 0  Afraid - awful might happen 2  Total GAD 7 Score 7  Anxiety Difficulty Somewhat difficult       Assessment & Plan:   Encounter to establish care Assessment & Plan: Welcome to [facility]! Screening labs ordered.  Uptodate on Mammogram, dental and eyes.     Anxiety Assessment & Plan: Pt has had heightened levels of panic since decline in parents health over last several months. Historically took xanax for panic a few years back, which pt was happy with.   PDMP reviewed.  Discussed risks of benzodiazepines, but given pt's hx and panic alleviation in past will reorder for pt.   Discussed other prn anxiety options - Buspar and Hydroxyzine. In future may try if pt has increased needs with Xanax.   Further need for anxiety mgmt may include CBT.  Orders: -     ALPRAZolam; Take 1 tablet (0.5 mg total) by mouth 2 (two) times daily as needed for anxiety.  Dispense: 20 tablet; Refill: 0  Primary hypertension Assessment & Plan: BP 138/79 today Reordered amlodipine and valsartan-hydrochlorothiazide DASH diet, monitor sodium intake, continue exercise regimen. -CMP ordered  Orders: -     Comprehensive metabolic panel -     amLODIPine Besylate; Take 1 tablet (5 mg total) by mouth daily.  Dispense: 30 tablet; Refill: 6 -     Valsartan-hydroCHLOROthiazide; Take 1 tablet by mouth daily.  Dispense: 30 tablet; Refill: 6  Hyperlipidemia, unspecified hyperlipidemia type Assessment & Plan: Will check lipid panel.  Reordered her daily Atorvastatin 20mg  - denies myalgias  Orders: -     Lipid panel -     Atorvastatin Calcium; Take 1 tablet (20 mg total) by mouth daily.  Dispense: 30 tablet; Refill: 6  History of prediabetes Assessment  & Plan: Will check AIC.   Discussed keeping up with exercise, water intake, and being aware of starchy carbs, increasing fruits, veggies, protein  Orders: -     Hemoglobin A1c  Need for influenza vaccination -     Flu vaccine trivalent PF, 6mos and older(Flulaval,Afluria,Fluarix,Fluzone)  Need for hepatitis C screening test -     Hepatitis C antibody  Screening for HIV (human immunodeficiency virus) -     HIV Antibody (routine testing w rflx)  Mild intermittent asthma without complication -     Albuterol Sulfate HFA; Inhale 1-2 puffs into the lungs every 6 (six) hours as needed for wheezing or shortness of breath.  Dispense: 1 each; Refill: 3  Seasonal allergic rhinitis, unspecified trigger Assessment & Plan: Reorded pt's as needed Flonase and nightly Singular.  No concerns during visit  Orders: -     Montelukast Sodium; Take 1 tablet (10 mg total) by mouth at bedtime.  Dispense: 30 tablet; Refill: 6 -     Fluticasone Propionate; Place 1-2 sprays into both nostrils 2 (two) times daily as needed for allergies or rhinitis.  Dispense: 16 g; Refill: 6  Episode of recurrent major depressive disorder, unspecified depression episode severity (HCC) Assessment & Plan: Reordered pt's bupropion - happy with her current mgmt for depression  Orders: -     buPROPion HCl ER (XL); Take 1 tablet (300 mg total) by mouth daily.  Dispense: 30 tablet; Refill: 6   All chronic daily maintenance medications reordered as while as needed prescribed medications for pt.   Will communicate lab results.  Return in about 4 months (around 12/21/2023) for annual physical.   I, Sallee Provencal, FNP, have reviewed all documentation for this visit. The documentation on 08/22/23 for the exam, diagnosis, procedures, and orders are all accurate and complete.   Sallee Provencal, FNP

## 2023-08-22 NOTE — Assessment & Plan Note (Addendum)
Will check lipid panel.  Reordered her daily Atorvastatin 20mg  - denies myalgias

## 2023-08-22 NOTE — Assessment & Plan Note (Signed)
Pt has had heightened levels of panic since decline in parents health over last several months. Historically took xanax for panic a few years back, which pt was happy with.   PDMP reviewed.  Discussed risks of benzodiazepines, but given pt's hx and panic alleviation in past will reorder for pt.   Discussed other prn anxiety options - Buspar and Hydroxyzine. In future may try if pt has increased needs with Xanax.   Further need for anxiety mgmt may include CBT.

## 2023-08-27 ENCOUNTER — Encounter: Payer: Self-pay | Admitting: Family Medicine

## 2023-09-18 LAB — COMPREHENSIVE METABOLIC PANEL
ALT: 17 [IU]/L (ref 0–32)
AST: 18 [IU]/L (ref 0–40)
Albumin: 4 g/dL (ref 3.8–4.9)
Alkaline Phosphatase: 86 [IU]/L (ref 44–121)
BUN/Creatinine Ratio: 26 (ref 12–28)
BUN: 22 mg/dL (ref 8–27)
Bilirubin Total: 0.3 mg/dL (ref 0.0–1.2)
CO2: 22 mmol/L (ref 20–29)
Calcium: 8.7 mg/dL (ref 8.7–10.3)
Chloride: 104 mmol/L (ref 96–106)
Creatinine, Ser: 0.84 mg/dL (ref 0.57–1.00)
Globulin, Total: 1.8 g/dL (ref 1.5–4.5)
Glucose: 97 mg/dL (ref 70–99)
Potassium: 3.9 mmol/L (ref 3.5–5.2)
Sodium: 140 mmol/L (ref 134–144)
Total Protein: 5.8 g/dL — ABNORMAL LOW (ref 6.0–8.5)
eGFR: 80 mL/min/{1.73_m2} (ref >59)

## 2023-09-18 LAB — HEMOGLOBIN A1C
Est. average glucose Bld gHb Est-mCnc: 120 mg/dL
Hgb A1c MFr Bld: 5.8 % — ABNORMAL HIGH (ref 4.8–5.6)

## 2023-09-18 LAB — LIPID PANEL
Chol/HDL Ratio: 2.6 {ratio} (ref 0.0–4.4)
Cholesterol, Total: 173 mg/dL (ref 100–199)
HDL: 67 mg/dL (ref >39)
LDL Chol Calc (NIH): 99 mg/dL (ref 0–99)
Triglycerides: 32 mg/dL (ref 0–149)
VLDL Cholesterol Cal: 7 mg/dL (ref 5–40)

## 2023-09-18 LAB — HEPATITIS C ANTIBODY: Hep C Virus Ab: NONREACTIVE

## 2023-09-18 LAB — HIV ANTIBODY (ROUTINE TESTING W REFLEX): HIV Screen 4th Generation wRfx: NONREACTIVE

## 2023-09-19 ENCOUNTER — Encounter: Payer: Self-pay | Admitting: Family Medicine

## 2023-12-31 ENCOUNTER — Encounter: Payer: Self-pay | Admitting: Family

## 2023-12-31 ENCOUNTER — Ambulatory Visit: Payer: Self-pay | Admitting: *Deleted

## 2023-12-31 ENCOUNTER — Ambulatory Visit: Admitting: Family

## 2023-12-31 VITALS — BP 124/78 | HR 100 | Temp 98.3°F | Ht 62.0 in | Wt 106.4 lb

## 2023-12-31 DIAGNOSIS — J020 Streptococcal pharyngitis: Secondary | ICD-10-CM | POA: Insufficient documentation

## 2023-12-31 DIAGNOSIS — J029 Acute pharyngitis, unspecified: Secondary | ICD-10-CM

## 2023-12-31 LAB — POCT RAPID STREP A (OFFICE): Rapid Strep A Screen: POSITIVE — AB

## 2023-12-31 MED ORDER — AZITHROMYCIN 250 MG PO TABS: ORAL_TABLET | ORAL | 0 refills | Status: AC

## 2023-12-31 NOTE — Progress Notes (Signed)
 Established Patient Office Visit  Subjective:   Patient ID: BLU MCGLAUN, female    DOB: Jul 12, 1961  Age: 63 y.o. MRN: [id number]  CC:  Chief Complaint  Patient presents with   Cough   Sore Throat   Shortness of Breath    All symptoms began in Thursday and has just continued. She has been taking allergy medicine. She also had some tessalon pills left over that she took last night    HPI: Katie Gallegos is a 63 y.o. female presenting on 12/31/2023 for Cough, Sore Throat, and Shortness of Breath (All symptoms began in Thursday and has just continued. She has been taking allergy medicine. She also had some tessalon pills left over that she took last night)   Six days ago started with cough, sob, tightness across the back,and sore throat. She is using tessalon perrles and did help her last night. Teeth were hurting slightly in the upper bil cheeks,but not currently. With some nasal congestion and runny nose.   Allergy medicine include zyrtec and montelukast and also using flonase.   Niece recently with walking pneumonia.  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         ROS: Negative unless specifically indicated above in HPI.   Relevant past medical history reviewed and updated as indicated.   Allergies and medications reviewed and updated.   Current Outpatient Medications:    albuterol (VENTOLIN HFA) 108 (90 Base) MCG/ACT inhaler, Inhale 1-2 puffs into the lungs every 6 (six) hours as needed for wheezing or shortness of breath., Disp: 1 each, Rfl: 3   ALPRAZolam (XANAX) 0.5 MG tablet, Take 1 tablet (0.5 mg total) by mouth 2 (two) times daily as needed for anxiety., Disp: 20 tablet, Rfl: 0   amLODipine (NORVASC) 5 MG tablet, Take 1 tablet (5 mg total) by mouth daily., Disp: 30 tablet, Rfl: 6   atorvastatin (LIPITOR) 20 MG tablet, Take 1 tablet (20 mg total) by mouth daily., Disp: 30 tablet, Rfl: 6   azithromycin (ZITHROMAX) 250 MG  tablet, Take 2 tablets on day 1, then 1 tablet daily on days 2 through 5, Disp: 6 tablet, Rfl: 0   buPROPion (WELLBUTRIN XL) 300 MG 24 hr tablet, Take 1 tablet (300 mg total) by mouth daily., Disp: 30 tablet, Rfl: 6   fexofenadine (ALLEGRA) 180 MG tablet, Take 180 mg by mouth daily., Disp: , Rfl:    fluticasone (FLONASE) 50 MCG/ACT nasal spray, Place 1-2 sprays into both nostrils 2 (two) times daily as needed for allergies or rhinitis., Disp: 16 g, Rfl: 6   meloxicam (MOBIC) 15 MG tablet, Take 15 mg by mouth daily. PRN, Disp: , Rfl:    montelukast (SINGULAIR) 10 MG tablet, Take 1 tablet (10 mg total) by mouth at bedtime., Disp: 30 tablet, Rfl: 6   Multiple Vitamin (MULTIVITAMIN) capsule, Take 1 capsule by mouth daily., Disp: , Rfl:    omeprazole (PRILOSEC) 40 MG capsule, Take 40 mg by mouth daily., Disp: , Rfl:    valACYclovir (VALTREX) 1000 MG tablet, , Disp: , Rfl:    valsartan-hydrochlorothiazide (DIOVAN-HCT) 160-25 MG tablet, Take 1 tablet by mouth daily., Disp: 30 tablet, Rfl: 6  Allergies  Allergen Reactions   Mucinex [Guaifenesin Er] Other (See Comments)    syncope   Penicillin G Other (See Comments)    Taken as child ? reaction    Objective:   BP 124/78   Pulse 100  Temp 98.3 F (36.8 C) (Oral)   Ht 5\' 2"  (1.575 m)   Wt 106 lb 6.4 oz (48.3 kg)   SpO2 99%   BMI 19.46 kg/m    Physical Exam Constitutional:      General: She is not in acute distress.    Appearance: Normal appearance. She is well-developed and normal weight. She is not ill-appearing, toxic-appearing or diaphoretic.  HENT:     Head: Normocephalic.     Right Ear: Tympanic membrane normal.     Left Ear: Tympanic membrane normal.     Nose: Nose normal.     Right Turbinates: Swollen.     Right Sinus: No maxillary sinus tenderness or frontal sinus tenderness.     Left Sinus: No maxillary sinus tenderness or frontal sinus tenderness.     Mouth/Throat:     Mouth: Mucous membranes are dry.     Pharynx:  Postnasal drip present. No oropharyngeal exudate or posterior oropharyngeal erythema.  Eyes:     Extraocular Movements: Extraocular movements intact.     Pupils: Pupils are equal, round, and reactive to light.  Cardiovascular:     Rate and Rhythm: Normal rate and regular rhythm.     Pulses: Normal pulses.     Heart sounds: Normal heart sounds.  Pulmonary:     Effort: Pulmonary effort is normal.     Breath sounds: Normal breath sounds.  Musculoskeletal:     Cervical back: Normal range of motion.  Neurological:     General: No focal deficit present.     Mental Status: She is alert and oriented to person, place, and time. Mental status is at baseline.  Psychiatric:        Mood and Affect: Mood normal.        Behavior: Behavior normal.        Thought Content: Thought content normal.        Judgment: Judgment normal.     Assessment & Plan:  Sore throat -     POCT rapid strep A  Strep pharyngitis Assessment & Plan: Strep tested positive in office.  Rx zpack 250 mg, unsure pcn allergy   Ibuprofen/tyelnol prn sore throat/fever Pt told to F/u if no improvement in the next 2-3 days.   Orders: -     Azithromycin; Take 2 tablets on day 1, then 1 tablet daily on days 2 through 5  Dispense: 6 tablet; Refill: 0     Follow up plan: Return for f/u PCP if no improvement in symptoms.  Felicita Horns, FNP

## 2023-12-31 NOTE — Assessment & Plan Note (Signed)
 Strep tested positive in office.  Rx zpack 250 mg, unsure pcn allergy   Ibuprofen/tyelnol prn sore throat/fever Pt told to F/u if no improvement in the next 2-3 days.

## 2023-12-31 NOTE — Telephone Encounter (Signed)
 Noted.

## 2023-12-31 NOTE — Telephone Encounter (Signed)
  Chief Complaint: cough, sore throat Symptoms: cough- think sputum, tightness across back, sore throat, nasal drainage- yellow/clear Frequency: started Thursday last week Pertinent Negatives: Patient denies fever- although has been warm Disposition: [] ED /[] Urgent Care (no appt availability in office) / [x] Appointment(In office/virtual)/ []  [facility]/ [] Home Care/ [] Refused Recommended Disposition /[] [facility]/ []  Follow-up with PCP Additional Notes: Patient has been scheduled at outside location   Copied from CRM [id number]. Topic: Clinical - Red Word Triage >> Dec 31, 2023  8:10 AM Zipporah Him wrote: Red Word that prompted transfer to Nurse Triage: Patient thinks she has bronchitis as of Thursday or Friday, Pain across back/tightness when coughing. Productive cough with mucus. Feeling bad Reason for Disposition  [1] MILD difficulty breathing (e.g., minimal/no SOB at rest, SOB with walking, pulse <100) AND [2] still present when not coughing  Answer Assessment - Initial Assessment Questions 1. ONSET: "When did the cough begin?"      Thursday 2. SEVERITY: "How bad is the cough today?"      A lot of mucus 3. SPUTUM: "Describe the color of your sputum" (none, dry cough; clear, white, yellow, green)     Unable to get up- thick, nasal discharge-yellow to clear 4. HEMOPTYSIS: "Are you coughing up any blood?" If so ask: "How much?" (flecks, streaks, tablespoons, etc.)     No- nose bleeds 2 weeks ago 5. DIFFICULTY BREATHING: "Are you having difficulty breathing?" If Yes, ask: "How bad is it?" (e.g., mild, moderate, severe)    - MILD: No SOB at rest, mild SOB with walking, speaks normally in sentences, can lie down, no retractions, pulse < 100.    - MODERATE: SOB at rest, SOB with minimal exertion and prefers to sit, cannot lie down flat, speaks in phrases, mild retractions, audible wheezing, pulse 100-120.    - SEVERE: Very SOB at rest, speaks in single words, struggling to  breathe, sitting hunched forward, retractions, pulse > 120      SOB- with cough, exertion- mild 6. FEVER: "Do you have a fever?" If Yes, ask: "What is your temperature, how was it measured, and when did it start?"     No- felt warm but no registered fever 7. CARDIAC HISTORY: "Do you have any history of heart disease?" (e.g., heart attack, congestive heart failure)      hypertension 8. LUNG HISTORY: "Do you have any history of lung disease?"  (e.g., pulmonary embolus, asthma, emphysema)     Prone to bronchitis/pneumonia   10. OTHER SYMPTOMS: "Do you have any other symptoms?" (e.g., runny nose, wheezing, chest pain)       Sore throat, tightness across the back  Protocols used: Cough - Acute Productive-A-AH

## 2023-12-31 NOTE — Patient Instructions (Signed)
------------------------------------    You were found to be strep positive,  Take antibiotics that have been sent to the pharmacy.  Change your toothbrush after 24 hours on the antibiotics.  Gargle with warm salt water as needed for sore throat.   ------------------------------------  

## 2024-01-07 ENCOUNTER — Other Ambulatory Visit: Payer: Self-pay | Admitting: Family Medicine

## 2024-01-07 DIAGNOSIS — F339 Major depressive disorder, recurrent, unspecified: Secondary | ICD-10-CM

## 2024-02-16 ENCOUNTER — Other Ambulatory Visit: Payer: Self-pay | Admitting: Family Medicine

## 2024-02-16 DIAGNOSIS — E785 Hyperlipidemia, unspecified: Secondary | ICD-10-CM

## 2024-02-16 DIAGNOSIS — I10 Essential (primary) hypertension: Secondary | ICD-10-CM

## 2024-03-27 ENCOUNTER — Other Ambulatory Visit: Payer: Self-pay | Admitting: Family Medicine

## 2024-03-27 DIAGNOSIS — J302 Other seasonal allergic rhinitis: Secondary | ICD-10-CM

## 2024-04-03 ENCOUNTER — Other Ambulatory Visit: Payer: Self-pay | Admitting: Family Medicine

## 2024-04-03 DIAGNOSIS — J302 Other seasonal allergic rhinitis: Secondary | ICD-10-CM

## 2024-04-03 DIAGNOSIS — I10 Essential (primary) hypertension: Secondary | ICD-10-CM

## 2024-04-08 NOTE — Patient Instructions (Signed)
 Preventive Care 39-61 Years Old, Female Preventive care refers to lifestyle choices and visits with your health care provider that can promote health and wellness. Preventive care visits are also called wellness exams. What can I expect for my preventive care visit? Counseling Your health care provider may ask you questions about your: Medical history, including: Past medical problems. Family medical history. Pregnancy history. Current health, including: Menstrual cycle. Method of birth control. Emotional well-being. Home life and relationship well-being. Sexual activity and sexual health. Lifestyle, including: Alcohol, nicotine or tobacco, and drug use. Access to firearms. Diet, exercise, and sleep habits. Work and work Astronomer. Sunscreen use. Safety issues such as seatbelt and bike helmet use. Physical exam Your health care provider will check your: Height and weight. These may be used to calculate your BMI (body mass index). BMI is a measurement that tells if you are at a healthy weight. Waist circumference. This measures the distance around your waistline. This measurement also tells if you are at a healthy weight and may help predict your risk of certain diseases, such as type 2 diabetes and high blood pressure. Heart rate and blood pressure. Body temperature. Skin for abnormal spots. What immunizations do I need?  Vaccines are usually given at various ages, according to a schedule. Your health care provider will recommend vaccines for you based on your age, medical history, and lifestyle or other factors, such as travel or where you work. What tests do I need? Screening Your health care provider may recommend screening tests for certain conditions. This may include: Lipid and cholesterol levels. Diabetes screening. This is done by checking your blood sugar (glucose) after you have not eaten for a while (fasting). Pelvic exam and Pap test. Hepatitis B test. Hepatitis C  test. HIV (human immunodeficiency virus) test. STI (sexually transmitted infection) testing, if you are at risk. Lung cancer screening. Colorectal cancer screening. Mammogram. Talk with your health care provider about when you should start having regular mammograms. This may depend on whether you have a family history of breast cancer. BRCA-related cancer screening. This may be done if you have a family history of breast, ovarian, tubal, or peritoneal cancers. Bone density scan. This is done to screen for osteoporosis. Talk with your health care provider about your test results, treatment options, and if necessary, the need for more tests. Follow these instructions at home: Eating and drinking  Eat a diet that includes fresh fruits and vegetables, whole grains, lean protein, and low-fat dairy products. Take vitamin and mineral supplements as recommended by your health care provider. Do not drink alcohol if: Your health care provider tells you not to drink. You are pregnant, may be pregnant, or are planning to become pregnant. If you drink alcohol: Limit how much you have to 0-1 drink a day. Know how much alcohol is in your drink. In the [place] one drink equals one 12 oz bottle of beer (355 mL), one 5 oz glass of wine (148 mL), or one 1 oz glass of hard liquor (44 mL). Lifestyle Brush your teeth every morning and night with fluoride toothpaste. Floss one time each day. Exercise for at least 30 minutes 5 or more days each week. Do not use any products that contain nicotine or tobacco. These products include cigarettes, chewing tobacco, and vaping devices, such as e-cigarettes. If you need help quitting, ask your health care provider. Do not use drugs. If you are sexually active, practice safe sex. Use a condom or other form of protection to  prevent STIs. If you do not wish to become pregnant, use a form of birth control. If you plan to become pregnant, see your health care provider for a  prepregnancy visit. Take aspirin only as told by your health care provider. Make sure that you understand how much to take and what form to take. Work with your health care provider to find out whether it is safe and beneficial for you to take aspirin daily. Find healthy ways to manage stress, such as: Meditation, yoga, or listening to music. Journaling. Talking to a trusted person. Spending time with friends and family. Minimize exposure to UV radiation to reduce your risk of skin cancer. Safety Always wear your seat belt while driving or riding in a vehicle. Do not drive: If you have been drinking alcohol. Do not ride with someone who has been drinking. When you are tired or distracted. While texting. If you have been using any mind-altering substances or drugs. Wear a helmet and other protective equipment during sports activities. If you have firearms in your house, make sure you follow all gun safety procedures. Seek help if you have been physically or sexually abused. What's next? Visit your health care provider once a year for an annual wellness visit. Ask your health care provider how often you should have your eyes and teeth checked. Stay up to date on all vaccines. This information is not intended to replace advice given to you by your health care provider. Make sure you discuss any questions you have with your health care provider. Document Revised: 03/01/2021 Document Reviewed: 03/01/2021 Elsevier Patient Education  2024 [employer] Breast Self-Awareness Breast self-awareness is knowing how your breasts look and feel. You need to: Check your breasts on a regular basis. Tell your doctor about any changes. Become familiar with the look and feel of your breasts. This can help you catch a breast problem while it is still small and can be treated. You should do breast self-exams even if you have breast implants. What you need: A mirror. A well-lit room. A pillow or other  soft object. How to do a breast self-exam Follow these steps to do a breast self-exam: Look for changes  Take off all the clothes above your waist. Stand in front of a mirror in a room with good lighting. Put your hands down at your sides. Compare your breasts in the mirror. Look for any difference between them, such as: A difference in shape. A difference in size. Wrinkles, dips, and bumps in one breast and not the other. Look at each breast for changes in the skin, such as: Redness. Scaly areas. Skin that has gotten thicker. Dimpling. Open sores (ulcers). Look for changes in your nipples, such as: Fluid coming out of a nipple. Fluid around a nipple. Bleeding. Dimpling. Redness. A nipple that looks pushed in (retracted), or that has changed position. Feel for changes Lie on your back. Feel each breast. To do this: Pick a breast to feel. Place a pillow under the shoulder closest to that breast. Put the arm closest to that breast behind your head. Feel the nipple area of that breast using the hand of your other arm. Feel the area with the pads of your three middle fingers by making small circles with your fingers. Use light, medium, and firm pressure. Continue the overlapping circles, moving downward over the breast. Keep making circles with your fingers. Stop when you feel your ribs. Start making circles with your fingers again, this time going  upward until you reach your collarbone. Then, make circles outward across your breast and into your armpit area. Squeeze your nipple. Check for discharge and lumps. Repeat these steps to check your other breast. Sit or stand in the tub or shower. With soapy water on your skin, feel each breast the same way you did when you were lying down. Write down what you find Writing down what you find can help you remember what to tell your doctor. Write down: What is normal for each breast. Any changes you find in each breast. These  include: The kind of changes you find. A tender or painful breast. Any lump you find. Write down its size and where it is. When you last had your monthly period (menstrual cycle). General tips If you are breastfeeding, the best time to check your breasts is after you feed your baby or after you use a breast pump. If you get monthly bleeding, the best time to check your breasts is 5-7 days after your monthly cycle ends. With time, you will become comfortable with the self-exam. You will also start to know if there are changes in your breasts. Contact a doctor if: You see a change in the shape or size of your breasts or nipples. You see a change in the skin of your breast or nipples, such as red or scaly skin. You have fluid coming from your nipples that is not normal. You find a new lump or thick area. You have breast pain. You have any concerns about your breast health. Summary Breast self-awareness includes looking for changes in your breasts and feeling for changes within your breasts. You should do breast self-awareness in front of a mirror in a well-lit room. If you get monthly periods (menstrual cycles), the best time to check your breasts is 5-7 days after your period ends. Tell your doctor about any changes you see in your breasts. Changes include changes in size, changes on the skin, painful or tender breasts, or fluid from your nipples that is not normal. This information is not intended to replace advice given to you by your health care provider. Make sure you discuss any questions you have with your health care provider. Document Revised: 02/08/2022 Document Reviewed: 07/06/2021 Elsevier Patient Education  2024 [employer].

## 2024-04-09 ENCOUNTER — Encounter: Payer: Self-pay | Admitting: Advanced Practice Midwife

## 2024-04-09 ENCOUNTER — Ambulatory Visit (INDEPENDENT_AMBULATORY_CARE_PROVIDER_SITE_OTHER): Admitting: Advanced Practice Midwife

## 2024-04-09 VITALS — BP 133/69 | HR 71 | Ht 62.5 in | Wt 110.8 lb

## 2024-04-09 DIAGNOSIS — Z13220 Encounter for screening for lipoid disorders: Secondary | ICD-10-CM

## 2024-04-09 DIAGNOSIS — Z1231 Encounter for screening mammogram for malignant neoplasm of breast: Secondary | ICD-10-CM

## 2024-04-09 DIAGNOSIS — Z01419 Encounter for gynecological examination (general) (routine) without abnormal findings: Secondary | ICD-10-CM | POA: Diagnosis not present

## 2024-04-09 DIAGNOSIS — Z131 Encounter for screening for diabetes mellitus: Secondary | ICD-10-CM

## 2024-04-09 NOTE — Progress Notes (Signed)
 [facility]   Gynecology Annual Exam  PCP: Wellington Curtis LABOR, FNP  Chief Complaint:  Chief Complaint  Patient presents with   Gynecologic Exam    History of Present Illness:Patient is a 63 y.o. [id number] presents for annual exam. The patient has complaint today of pain during intercourse. She has tried OTC lube with little relief. Suggested she try coconut oil or a preparation with Hyaluronic Acid or a PO supplement of the same. She declines vaginal estrogen at this time. Has a new concern in the past few months of reflux/throat pain. She is planning to see a specialist. Is under the care of PCP for HTN.  LMP: No LMP recorded. Patient is postmenopausal.  Postcoital Bleeding: no   The patient is sexually active. She admits to dyspareunia.  The patient does perform self breast exams.  There is no notable family history of breast or ovarian cancer in her family.  The patient wears seatbelts: yes.   The patient has regular exercise: continues to do dance workout, cardio and some weights, admits healthy diet and adequate hydration and sleep.    The patient denies current symptoms of depression.  She has increased stress at this time due to her dad's illness/in Hospice and her mom with dementia.  Review of Systems: Review of Systems  Constitutional:  Negative for chills and fever.  HENT:  Negative for congestion, ear discharge, ear pain, hearing loss, sinus pain and sore throat.   Eyes:  Negative for blurred vision and double vision.  Respiratory:  Negative for cough, shortness of breath and wheezing.   Cardiovascular:  Negative for chest pain, palpitations and leg swelling.  Gastrointestinal:  Negative for abdominal pain, blood in stool, constipation, diarrhea, heartburn, melena, nausea and vomiting.       Positive for reflux  Genitourinary:  Negative for dysuria, flank pain, frequency, hematuria and urgency.  Musculoskeletal:  Positive for back pain. Negative for joint pain and  myalgias.  Skin:  Negative for itching and rash.  Neurological:  Negative for dizziness, tingling, tremors, sensory change, speech change, focal weakness, seizures, loss of consciousness, weakness and headaches.  Endo/Heme/Allergies:  Negative for environmental allergies. Does not bruise/bleed easily.  Psychiatric/Behavioral:  Negative for depression, hallucinations, memory loss, substance abuse and suicidal ideas. The patient is not nervous/anxious and does not have insomnia.     Past Medical History:  Patient Active Problem List   Diagnosis Date Noted   History of prediabetes 08/22/2023   Lumbar radiculopathy 02/01/2022   Low back pain 01/02/2022   Hyperlipidemia 11/01/2018   IBS (irritable bowel syndrome)    Hypertension    GERD (gastroesophageal reflux disease)    Depression    Anxiety    Allergic rhinitis     Past Surgical History:  Past Surgical History:  Procedure Laterality Date   BILATERAL CARPAL TUNNEL RELEASE  March and April 2017   BREAST BIOPSY Left 2013   benign   COLONOSCOPY  2011   ESOPHAGOGASTRODUODENOSCOPY ENDOSCOPY  2016   GERD   TUBAL LIGATION      Gynecologic History:  No LMP recorded. Patient is postmenopausal. Last Pap: 2024 Results were:  no abnormalities  Last mammogram: 2024 Results were: BI-RAD I  Obstetric History: G2P2002  Family History:  Family History  Problem Relation Age of Onset   Hypertension Mother    Stroke Mother    Hypertension Father    Kidney Stones Father    Parkinson's disease Father    Uterine cancer Paternal Aunt  40   Diabetes Maternal Grandmother    Heart disease Maternal Grandmother    Bladder Cancer Paternal Grandfather    Colon cancer Paternal Grandfather 55   Breast cancer Neg Hx     Social History:  Social History   Socioeconomic History   Marital status: Married    Spouse name: Sam   Number of children: 2   Years of education: Not on file   Highest education level: Not on file  Occupational History    Not on file  Tobacco Use   Smoking status: Never   Smokeless tobacco: Never  Vaping Use   Vaping status: Never Used  Substance and Sexual Activity   Alcohol use: Yes    Comment: 2 beers per week   Drug use: No   Sexual activity: Yes    Partners: Male    Birth control/protection: Post-menopausal  Other Topics Concern   Not on file  Social History Narrative   Not on file   Social Drivers of Health   Financial Resource Strain: Not on file  Food Insecurity: Not on file  Transportation Needs: Not on file  Physical Activity: Not on file  Stress: Not on file  Social Connections: Not on file  Intimate Partner Violence: Not on file    Allergies:  Allergies  Allergen Reactions   Mucinex [Guaifenesin Er] Other (See Comments)    syncope   Penicillin G Other (See Comments)    Taken as child ? reaction    Medications: Prior to Admission medications   Medication Sig Start Date End Date Taking? Authorizing Provider  albuterol  (VENTOLIN  HFA) 108 (90 Base) MCG/ACT inhaler Inhale 1-2 puffs into the lungs every 6 (six) hours as needed for wheezing or shortness of breath. 08/22/23  Yes Clifton, Kellie A, FNP  ALPRAZolam  (XANAX ) 0.5 MG tablet Take 1 tablet (0.5 mg total) by mouth 2 (two) times daily as needed for anxiety. 08/22/23  Yes Clifton, Kellie A, FNP  amLODipine  (NORVASC ) 5 MG tablet TAKE 1 TABLET (5 MG TOTAL) BY MOUTH DAILY. 02/17/24  Yes Wellington Beams A, FNP  atorvastatin  (LIPITOR) 20 MG tablet TAKE 1 TABLET BY MOUTH EVERY DAY 02/17/24  Yes Clifton, Kellie A, FNP  buPROPion  (WELLBUTRIN  XL) 300 MG 24 hr tablet TAKE 1 TABLET BY MOUTH EVERY DAY FOR ANXIETY AND DEPRESSION 01/07/24  Yes Wellington Beams A, FNP  famotidine (PEPCID) 20 MG tablet Take 20 mg by mouth 2 (two) times daily. 03/11/24  Yes [provider]  fexofenadine (ALLEGRA) 180 MG tablet Take 180 mg by mouth daily.   Yes [provider]  fluticasone  (FLONASE ) 50 MCG/ACT nasal spray PLACE 1-2 SPRAYS INTO BOTH  NOSTRILS 2 (TWO) TIMES DAILY AS NEEDED FOR ALLERGIES OR RHINITIS. 03/27/24  Yes Wellington Beams A, FNP  meloxicam (MOBIC) 15 MG tablet Take 15 mg by mouth daily. PRN   Yes [provider]  montelukast  (SINGULAIR ) 10 MG tablet TAKE 1 TABLET BY MOUTH EVERYDAY AT BEDTIME 04/03/24  Yes Clifton, Beams A, FNP  omeprazole (PRILOSEC) 40 MG capsule Take 40 mg by mouth daily.   Yes [provider]  valACYclovir (VALTREX) 1000 MG tablet    Yes [provider]  valsartan -hydrochlorothiazide  (DIOVAN -HCT) 160-25 MG tablet TAKE 1 TABLET BY MOUTH EVERY DAY 04/03/24  Yes Wellington Beams LABOR, FNP    Physical Exam Vitals: Blood pressure 133/69, pulse 71, height 5' 2.5 (1.588 m), weight 110 lb 12.8 oz (50.3 kg).  General: NAD HEENT: normocephalic, anicteric Thyroid: no enlargement, no palpable  nodules Pulmonary: No increased work of breathing, CTAB Cardiovascular: RRR, distal pulses 2+ Breast: Breast symmetrical, no tenderness, no palpable nodules or masses, no skin or nipple retraction present, no nipple discharge.  No axillary or supraclavicular lymphadenopathy. Abdomen: NABS, soft, non-tender, non-distended.  Umbilicus without lesions.  No hepatomegaly, splenomegaly or masses palpable. No evidence of hernia  Genitourinary: deferred for no concerns/PAP interval Extremities: no edema, erythema, or tenderness Neurologic: Grossly intact Psychiatric: mood appropriate, affect full    Assessment: 63 y.o. G2P2002 routine annual exam  Plan: Problem List Items Addressed This Visit   None Visit Diagnoses       Well woman exam with routine gynecological exam    -  Primary   Relevant Orders   CBC   Hemoglobin A1c   Lipid panel   TSH   Comp Met (CMET)   MM 3D SCREENING MAMMOGRAM BILATERAL BREAST     Screening mammogram for breast cancer       Relevant Orders   MM 3D SCREENING MAMMOGRAM BILATERAL BREAST     Screening for diabetes mellitus       Relevant Orders   Hemoglobin A1c      Screening, lipid       Relevant Orders   Lipid panel       1) Mammogram - recommend yearly screening mammogram.  Mammogram Was ordered today  2) STI screening  was offered and declined  3) ASCCP guidelines and rationale discussed.  Patient opts for every 3 years screening interval. Next due in 2027  4) Osteoporosis  - per USPTF routine screening DEXA at age 3  Consider FDA-approved medical therapies in postmenopausal women and men aged 44 years and older, based on the following: a) A hip or vertebral (clinical or morphometric) fracture b) T-score <= -2.5 at the femoral neck or spine after appropriate evaluation to exclude secondary causes C) Low bone mass (T-score between -1.0 and -2.5 at the femoral neck or spine) and a 10-year probability of a hip fracture >= 3% or a 10-year probability of a major osteoporosis-related fracture >= 20% based on the [place] -adapted WHO algorithm   5) Routine healthcare maintenance including cholesterol, diabetes screening discussed Ordered today  6) Colonoscopy is up to date.  Screening recommended starting at age 70 for average risk individuals, age 40 for individuals deemed at increased risk (including African Americans) and recommended to continue until age 77.  For patient age 55-85 individualized approach is recommended.  Gold standard screening is via colonoscopy, Cologuard screening is an acceptable alternative for patient unwilling or unable to undergo colonoscopy.  Colorectal cancer screening for average?risk adults: 2018 guideline update from the American Cancer SocietyCA: A Cancer Journal for Clinicians: Feb 13, 2017   7) Return in about 1 year (around 04/09/2025) for annual established gyn.   Slater Rains, CNM [facility] 04/09/2024 5:36 PM

## 2024-04-10 LAB — COMPREHENSIVE METABOLIC PANEL WITH GFR
ALT: 17 IU/L (ref 0–32)
AST: 18 IU/L (ref 0–40)
Albumin: 4.4 g/dL (ref 3.8–4.9)
Alkaline Phosphatase: 99 IU/L (ref 44–121)
BUN/Creatinine Ratio: 20 (ref 12–28)
BUN: 14 mg/dL (ref 8–27)
Bilirubin Total: <0.2 mg/dL (ref 0.0–1.2)
CO2: 24 mmol/L (ref 20–29)
Calcium: 9.8 mg/dL (ref 8.7–10.3)
Chloride: 99 mmol/L (ref 96–106)
Creatinine, Ser: 0.7 mg/dL (ref 0.57–1.00)
Globulin, Total: 2.2 g/dL (ref 1.5–4.5)
Glucose: 81 mg/dL (ref 70–99)
Potassium: 3.9 mmol/L (ref 3.5–5.2)
Sodium: 139 mmol/L (ref 134–144)
Total Protein: 6.6 g/dL (ref 6.0–8.5)
eGFR: 99 mL/min/1.73 (ref >59)

## 2024-04-10 LAB — TSH: TSH: 1.28 u[IU]/mL (ref 0.450–4.500)

## 2024-04-10 LAB — CBC
Hematocrit: 37.5 % (ref 34.0–46.6)
Hemoglobin: 12.2 g/dL (ref 11.1–15.9)
MCH: 29.8 pg (ref 26.6–33.0)
MCHC: 32.5 g/dL (ref 31.5–35.7)
MCV: 92 fL (ref 79–97)
Platelets: 383 x10E3/uL (ref 150–450)
RBC: 4.1 x10E6/uL (ref 3.77–5.28)
RDW: 13.2 % (ref 11.7–15.4)
WBC: 5.3 x10E3/uL (ref 3.4–10.8)

## 2024-04-10 LAB — LIPID PANEL
Chol/HDL Ratio: 2.4 ratio (ref 0.0–4.4)
Cholesterol, Total: 164 mg/dL (ref 100–199)
HDL: 68 mg/dL (ref >39)
LDL Chol Calc (NIH): 84 mg/dL (ref 0–99)
Triglycerides: 58 mg/dL (ref 0–149)
VLDL Cholesterol Cal: 12 mg/dL (ref 5–40)

## 2024-04-10 LAB — HEMOGLOBIN A1C
Est. average glucose Bld gHb Est-mCnc: 114 mg/dL
Hgb A1c MFr Bld: 5.6 % (ref 4.8–5.6)

## 2024-04-18 ENCOUNTER — Ambulatory Visit: Payer: Self-pay | Admitting: Advanced Practice Midwife

## 2024-04-20 ENCOUNTER — Ambulatory Visit

## 2024-04-20 DIAGNOSIS — R0989 Other specified symptoms and signs involving the circulatory and respiratory systems: Secondary | ICD-10-CM | POA: Diagnosis not present

## 2024-04-20 DIAGNOSIS — K219 Gastro-esophageal reflux disease without esophagitis: Secondary | ICD-10-CM | POA: Diagnosis present

## 2024-05-15 ENCOUNTER — Ambulatory Visit
Admission: RE | Admit: 2024-05-15 | Discharge: 2024-05-15 | Disposition: A | Discharge status: Home / Self Care | Source: Ambulatory Visit | Attending: Advanced Practice Midwife | Admitting: Advanced Practice Midwife

## 2024-05-15 DIAGNOSIS — Z01419 Encounter for gynecological examination (general) (routine) without abnormal findings: Secondary | ICD-10-CM | POA: Diagnosis present

## 2024-05-15 DIAGNOSIS — Z1231 Encounter for screening mammogram for malignant neoplasm of breast: Secondary | ICD-10-CM | POA: Insufficient documentation

## 2024-05-21 ENCOUNTER — Other Ambulatory Visit: Payer: Self-pay | Admitting: Family Medicine

## 2024-05-21 DIAGNOSIS — I10 Essential (primary) hypertension: Secondary | ICD-10-CM

## 2024-05-21 DIAGNOSIS — E785 Hyperlipidemia, unspecified: Secondary | ICD-10-CM

## 2024-07-21 ENCOUNTER — Other Ambulatory Visit: Payer: Self-pay | Admitting: Family Medicine

## 2024-07-21 DIAGNOSIS — F339 Major depressive disorder, recurrent, unspecified: Secondary | ICD-10-CM

## 2024-08-18 ENCOUNTER — Other Ambulatory Visit: Payer: Self-pay

## 2024-08-18 ENCOUNTER — Telehealth: Payer: Self-pay | Admitting: Family Medicine

## 2024-08-18 DIAGNOSIS — E785 Hyperlipidemia, unspecified: Secondary | ICD-10-CM

## 2024-08-18 DIAGNOSIS — I10 Essential (primary) hypertension: Secondary | ICD-10-CM

## 2024-08-18 MED ORDER — AMLODIPINE BESYLATE 5 MG PO TABS: 5.0000 mg | ORAL_TABLET | Freq: Every day | ORAL | 0 refills | Status: DC

## 2024-08-18 MED ORDER — ATORVASTATIN CALCIUM 20 MG PO TABS: 20.0000 mg | ORAL_TABLET | Freq: Every day | ORAL | 0 refills | Status: DC

## 2024-08-18 NOTE — Telephone Encounter (Signed)
 A courtesy refill will be sent, pt will need to establish care with a new PCP. Pt last OV 08/21/2023.

## 2024-08-18 NOTE — Telephone Encounter (Signed)
 [facility] faxed refill request for the following medications:   amLODipine  (NORVASC ) 5 MG tablet    atorvastatin  (LIPITOR) 20 MG tablet    Please advise.

## 2024-09-16 ENCOUNTER — Ambulatory Visit: Payer: Self-pay

## 2024-09-16 NOTE — Telephone Encounter (Signed)
 FYI Only or Action Required?: FYI only for provider: UC advised .  Patient was last seen in primary care on 12/31/2023 by Katie Antu, FNP.  Called Nurse Triage reporting Cough.  Symptoms began several days ago.  Interventions attempted: Other: zinc .  Symptoms are: stable.  Triage Disposition: See Physician Within 24 Hours  Patient/caregiver understands and will follow disposition?:           Reason for Disposition  Earache  Answer Assessment - Initial Assessment Questions Patient reports symptoms  started around 20-21st sore throat cough congestion. Cough getting worse coughing up yellow mucus and blowing nose yellow  mucus. Checked yesterday for fever didn't have one. Able to take deep breaths. Hx pneumonia  and bronchitis per pt report . Not wheezing. Cough is moderate. Ears pressure hurting yesterday. PCP left office last month patient waiting to see new PCP ha appointment 10/01/23 UC advised for this patient is agreeable and going to UC.     1. ONSET: When did the cough begin?      Around the 20th-21th  2. SEVERITY: How bad is the cough today?      Moderate  3. SPUTUM: Describe the color of your sputum (e.g., none, dry cough; clear, white, yellow, green)     Ye.llow mucus 4. HEMOPTYSIS: Are you coughing up any blood? If Yes, ask: How much? (e.g., flecks, streaks, tablespoons, etc.)     Denies  5. DIFFICULTY BREATHING: Are you having difficulty breathing? If Yes, ask: How bad is it? (e.g., mild, moderate, severe)      Denies  6. FEVER: Do you have a fever? If Yes, ask: What is your temperature, how was it measured, and when did it start?     Took tempature  yesterday no fever   7. CARDIAC HISTORY: Do you have any history of heart disease? (e.g., heart attack, congestive heart failure)      Hypertension  8. LUNG HISTORY: Do you have any history of lung disease?  (e.g., pulmonary embolus, asthma, emphysema)     Denies  9. PE RISK FACTORS: Do  you have a history of blood clots? (or: recent major surgery, recent prolonged travel, bedridden)     Denies  10. OTHER SYMPTOMS: Do you have any other symptoms? (e.g., runny nose, wheezing, chest pain)       Congestion , a lot of coughing, yesterday pressure both ears , today sinus headache above forehead pain level is 5/10 able to ADLs just not feeling not that great. Blowing nose yellow.   Patient denies the following chest pain when not coughing, shortness of breath when not coughing , fever, wheezing  Protocols used: Cough - Acute Productive-A-AH Copied from CRM #[id number]. Topic: Clinical - Red Word Triage >> Sep 16, 2024  8:25 AM Avram MATSU wrote: Red Word that prompted transfer to Nurse Triage: winded when coughing, yellow mucus, sore throat, congested

## 2024-09-25 ENCOUNTER — Telehealth: Payer: Self-pay | Admitting: Family Medicine

## 2024-09-25 DIAGNOSIS — J4520 Mild intermittent asthma, uncomplicated: Secondary | ICD-10-CM

## 2024-09-25 MED ORDER — ALBUTEROL SULFATE HFA 108 (90 BASE) MCG/ACT IN AERS: 1.0000 | INHALATION_SPRAY | Freq: Four times a day (QID) | RESPIRATORY_TRACT | 3 refills | Status: DC | PRN

## 2024-09-25 NOTE — Telephone Encounter (Signed)
 [facility] faxed refill request for the following medications:   albuterol (VENTOLIN HFA) 108 (90 Base) MCG/ACT inhaler    Please advise.

## 2024-09-30 ENCOUNTER — Ambulatory Visit

## 2024-09-30 VITALS — BP 138/73 | HR 67 | Temp 97.8°F | Ht 62.0 in | Wt 109.9 lb

## 2024-09-30 DIAGNOSIS — F339 Major depressive disorder, recurrent, unspecified: Secondary | ICD-10-CM

## 2024-09-30 DIAGNOSIS — Z0001 Encounter for general adult medical examination with abnormal findings: Secondary | ICD-10-CM | POA: Diagnosis not present

## 2024-09-30 DIAGNOSIS — I10 Essential (primary) hypertension: Secondary | ICD-10-CM

## 2024-09-30 DIAGNOSIS — J4520 Mild intermittent asthma, uncomplicated: Secondary | ICD-10-CM | POA: Diagnosis not present

## 2024-09-30 DIAGNOSIS — Z23 Encounter for immunization: Secondary | ICD-10-CM | POA: Diagnosis not present

## 2024-09-30 DIAGNOSIS — M7712 Lateral epicondylitis, left elbow: Secondary | ICD-10-CM | POA: Diagnosis not present

## 2024-09-30 DIAGNOSIS — E785 Hyperlipidemia, unspecified: Secondary | ICD-10-CM

## 2024-09-30 DIAGNOSIS — F419 Anxiety disorder, unspecified: Secondary | ICD-10-CM | POA: Diagnosis not present

## 2024-09-30 DIAGNOSIS — Z0000 Encounter for general adult medical examination without abnormal findings: Secondary | ICD-10-CM

## 2024-09-30 MED ORDER — ALBUTEROL SULFATE HFA 108 (90 BASE) MCG/ACT IN AERS: 1.0000 | INHALATION_SPRAY | Freq: Four times a day (QID) | RESPIRATORY_TRACT | 3 refills | Status: AC | PRN

## 2024-09-30 MED ORDER — ALPRAZOLAM 0.5 MG PO TABS: 0.5000 mg | ORAL_TABLET | Freq: Two times a day (BID) | ORAL | 0 refills | Status: AC | PRN

## 2024-09-30 MED ORDER — ESCITALOPRAM OXALATE 10 MG PO TABS: 10.0000 mg | ORAL_TABLET | Freq: Every day | ORAL | 3 refills | Status: AC

## 2024-09-30 MED ORDER — AMLODIPINE BESYLATE 5 MG PO TABS: 5.0000 mg | ORAL_TABLET | Freq: Every day | ORAL | 3 refills | Status: AC

## 2024-09-30 MED ORDER — MELOXICAM 15 MG PO TABS: 15.0000 mg | ORAL_TABLET | Freq: Every day | ORAL | 3 refills | Status: AC

## 2024-09-30 MED ORDER — BUPROPION HCL ER (XL) 300 MG PO TB24: 300.0000 mg | ORAL_TABLET | Freq: Every day | ORAL | 3 refills | Status: AC

## 2024-09-30 MED ORDER — VALSARTAN-HYDROCHLOROTHIAZIDE 160-25 MG PO TABS: 1.0000 | ORAL_TABLET | Freq: Every day | ORAL | 3 refills | Status: AC

## 2024-09-30 MED ORDER — ATORVASTATIN CALCIUM 20 MG PO TABS: 20.0000 mg | ORAL_TABLET | Freq: Every day | ORAL | 3 refills | Status: AC

## 2024-09-30 NOTE — Progress Notes (Signed)
 Admiistered the flu vaccine in the left deltoid. Patient tolerated it well.  Administered the tdap vaccine in the right deltoid patient tolerated it well.

## 2024-09-30 NOTE — Progress Notes (Signed)
 "    Complete physical exam   Patient: Katie Gallegos   DOB: 1961/09/12   64 y.o. Female  MRN: [id number] Visit Date: 09/30/2024  Today's healthcare provider: Isaiah DELENA Pepper, MD   Chief Complaint  Patient presents with   Annual Exam   Subjective    Katie Gallegos is a 64 y.o. female who presents today for a complete physical exam and to discuss her mood.   Discussed the use of AI scribe software for clinical note transcription with the patient, who gave verbal consent to proceed.  History of Present Illness Katie Gallegos is a 64 year old female who presents for an annual physical exam and medication refills.  She is currently taking Wellbutrin , amlodipine , atorvastatin , meloxicam , Xanax , valsartan , montelukast , and an over-the-counter allergy medication. Albuterol  is used as needed, especially before workouts, due to shortness of breath. She experiences breathlessness during exercise, which sometimes requires her to stop and use the inhaler.  She describes significant stress related to her parents' declining health. Her mother has dementia, and her father has Parkinson's disease, requiring her to assist with their care every other weekend. She experienced a panic attack recently when her mother fell, and she was unable to assist immediately due to her own distress. Xanax  is used once or twice a month, primarily due to stress from her parents' health issues.  She has a history of tennis elbow and back issues, including a past spinal injury that causes pain and requires periodic injections. She reports that she felt something in her arm while using a Hoyer lift for her father, which she believes may have contributed to her tennis elbow.  She tries to maintain a healthy diet, avoids soft drinks, and occasionally consumes sweets and alcohol. She does not smoke.   Last depression screening scores    09/30/2024    9:02 AM 12/31/2023    9:27 AM 08/22/2023    4:03 PM  PHQ 2/9 Scores  PHQ -  2 Score 1 2 2   PHQ- 9 Score 3 7  11       Data saved with a previous flowsheet row definition   Last fall risk screening    12/31/2023    9:27 AM  Fall Risk   Falls in the past year? 0  Number falls in past yr: 0  Injury with Fall? 0   Risk for fall due to : No Fall Risks  Follow up Falls evaluation completed     Data saved with a previous flowsheet row definition        Medications: Show/hide medication list[1]  Review of Systems as noted in HPI.     Objective    BP 138/73   Pulse 67   Temp 97.8 F (36.6 C) (Oral)   Ht 5' 2 (1.575 m)   Wt 109 lb 14.4 oz (49.9 kg)   SpO2 99%   BMI 20.10 kg/m    Physical Exam Constitutional:      Appearance: Normal appearance.  HENT:     Head: Normocephalic and atraumatic.     Mouth/Throat:     Mouth: Mucous membranes are moist.  Eyes:     Pupils: Pupils are equal, round, and reactive to light.  Cardiovascular:     Rate and Rhythm: Normal rate and regular rhythm.     Heart sounds: Normal heart sounds.  Pulmonary:     Effort: Pulmonary effort is normal.     Breath sounds: Normal breath sounds.  Skin:  General: Skin is warm.     Comments: SK noted superior to L temple.  Neurological:     General: No focal deficit present.     Mental Status: She is alert.      No results found for any visits on 09/30/24.  Assessment & Plan    Routine Health Maintenance and Physical Exam  Immunization History  Administered Date(s) Administered   Influenza, Seasonal, Injecte, Preservative Fre 08/22/2023, 09/30/2024   Influenza,inj,Quad PF,6+ Mos 07/21/2021   Influenza-Unspecified 06/30/2019   Td 01/31/1994   Tdap 09/30/2024    Health Maintenance  Topic Date Due   COVID-19 Vaccine (1 - 2025-26 season) Never done   Zoster Vaccines- Shingrix (1 of 2) 12/29/2024 (Originally 06/23/2013)   Pneumococcal Vaccine: 50+ Years (1 of 2 - PCV) 09/30/2025 (Originally 06/23/1982)   Mammogram  05/15/2026   Cervical Cancer Screening  (HPV/Pap Cotest)  02/05/2028   Colonoscopy  05/25/2031   DTaP/Tdap/Td (3 - Td or Tdap) 09/30/2034   Influenza Vaccine  Completed   Hepatitis C Screening  Completed   HIV Screening  Completed   Hepatitis B Vaccines 19-59 Average Risk  Aged Out   HPV VACCINES  Aged Out   Meningococcal B Vaccine  Aged Out    Discussed health benefits of physical activity, and encouraged her to engage in regular exercise appropriate for her age and condition.  Problem List Items Addressed This Visit       Cardiovascular and Mediastinum   Hypertension   Relevant Medications   amLODipine  (NORVASC ) 5 MG tablet   atorvastatin  (LIPITOR) 20 MG tablet   valsartan -hydrochlorothiazide  (DIOVAN -HCT) 160-25 MG tablet     Respiratory   Mild intermittent asthma without complication   Relevant Medications   albuterol  (VENTOLIN  HFA) 108 (90 Base) MCG/ACT inhaler     Musculoskeletal and Integument   Lateral epicondylitis of left elbow   Relevant Medications   meloxicam  (MOBIC ) 15 MG tablet     Other   Depression   Relevant Medications   ALPRAZolam  (XANAX ) 0.5 MG tablet   buPROPion  (WELLBUTRIN  XL) 300 MG 24 hr tablet   escitalopram  (LEXAPRO ) 10 MG tablet   Anxiety   Relevant Medications   ALPRAZolam  (XANAX ) 0.5 MG tablet   buPROPion  (WELLBUTRIN  XL) 300 MG 24 hr tablet   escitalopram  (LEXAPRO ) 10 MG tablet   Hyperlipidemia   Relevant Medications   amLODipine  (NORVASC ) 5 MG tablet   atorvastatin  (LIPITOR) 20 MG tablet   valsartan -hydrochlorothiazide  (DIOVAN -HCT) 160-25 MG tablet   Other Visit Diagnoses       Encounter for annual physical exam    -  Primary     Need for vaccination       Relevant Orders   Flu vaccine trivalent PF, 6mos and older(Flulaval,Afluria,Fluarix,Fluzone) (Completed)   Tdap vaccine greater than or equal to 7yo IM (Completed)      Assessment & Plan Annual Exam Discussed eating a balanced diet and incorporating movement into daily routine. Counseled on vaccines. Given flu  and Tdap today.  Major depressive disorder and anxiety disorder Chronic depression and anxiety exacerbated by stress. Wellbutrin  may not fully address anxiety. Lexapro  added for anxiety management. Discussed Lexapro  side effects. - Started Lexapro  10 mg daily. - Continue Wellbutrin . - Use Xanax  as needed for acute panic attacks. - Follow-up in 6 weeks to assess Lexapro  response.  Primary hypertension Chronic hypertension managed with amlodipine  and Diovan . - Refilled amlodipine  and  Diovan  prescriptions.  Hyperlipidemia Chronic hyperlipidemia managed with atorvastatin . - Refilled atorvastatin  prescription.  Mild intermittent asthma Exercise-induced symptoms managed with albuterol . - Refilled albuterol  inhaler.  Lateral epicondylitis Chronic condition exacerbated by physical strain. - Refilled meloxicam  prescription.    Return in about 6 weeks (around 11/11/2024) for Follow Up.     Isaiah DELENA Pepper, MD  [facility] [phone] (phone) [phone] (fax)     [1]  Outpatient Medications Prior to Visit  Medication Sig   fexofenadine (ALLEGRA) 180 MG tablet Take 180 mg by mouth daily.   ipratropium (ATROVENT) 0.06 % nasal spray Place 1 spray into both nostrils.   montelukast  (SINGULAIR ) 10 MG tablet TAKE 1 TABLET BY MOUTH EVERYDAY AT BEDTIME   valACYclovir (VALTREX) 1000 MG tablet    [DISCONTINUED] albuterol  (VENTOLIN  HFA) 108 (90 Base) MCG/ACT inhaler Inhale 1-2 puffs into the lungs every 6 (six) hours as needed for wheezing or shortness of breath.   [DISCONTINUED] ALPRAZolam  (XANAX ) 0.5 MG tablet Take 1 tablet (0.5 mg total) by mouth 2 (two) times daily as needed for anxiety.   [DISCONTINUED] amLODipine  (NORVASC ) 5 MG tablet Take 1 tablet (5 mg total) by mouth daily.   [DISCONTINUED] atorvastatin  (LIPITOR) 20 MG tablet Take 1 tablet (20 mg total) by mouth daily.   [DISCONTINUED] famotidine (PEPCID) 20 MG tablet Take 20 mg by mouth 2 (two) times  daily.   [DISCONTINUED] meloxicam  (MOBIC ) 15 MG tablet Take 15 mg by mouth daily. PRN   [DISCONTINUED] omeprazole (PRILOSEC) 40 MG capsule Take 40 mg by mouth daily.   [DISCONTINUED] valsartan -hydrochlorothiazide  (DIOVAN -HCT) 160-25 MG tablet TAKE 1 TABLET BY MOUTH EVERY DAY   [DISCONTINUED] buPROPion  (WELLBUTRIN  XL) 300 MG 24 hr tablet TAKE 1 TABLET BY MOUTH EVERY DAY FOR ANXIETY AND DEPRESSION   [DISCONTINUED] fluticasone  (FLONASE ) 50 MCG/ACT nasal spray PLACE 1-2 SPRAYS INTO BOTH NOSTRILS 2 (TWO) TIMES DAILY AS NEEDED FOR ALLERGIES OR RHINITIS. (Patient not taking: Reported on 09/30/2024)   No facility-administered medications prior to visit.   "

## 2024-11-11 ENCOUNTER — Ambulatory Visit
